# Patient Record
Sex: Female | Born: 1955 | Race: White | Hispanic: No | Marital: Single | Smoking: Never smoker
Health system: Southern US, Community
[De-identification: ages and names within clinical notes are randomized; demographics above are authoritative.]

## PROBLEM LIST (undated history)

## (undated) DIAGNOSIS — E0500 Thyrotoxicosis with diffuse goiter without thyrotoxic crisis or storm: Secondary | ICD-10-CM

## (undated) DIAGNOSIS — M1990 Unspecified osteoarthritis, unspecified site: Secondary | ICD-10-CM

## (undated) DIAGNOSIS — I10 Essential (primary) hypertension: Secondary | ICD-10-CM

## (undated) DIAGNOSIS — F329 Major depressive disorder, single episode, unspecified: Secondary | ICD-10-CM

## (undated) DIAGNOSIS — E785 Hyperlipidemia, unspecified: Secondary | ICD-10-CM

## (undated) DIAGNOSIS — E079 Disorder of thyroid, unspecified: Secondary | ICD-10-CM

## (undated) DIAGNOSIS — I669 Occlusion and stenosis of unspecified cerebral artery: Secondary | ICD-10-CM

## (undated) DIAGNOSIS — N390 Urinary tract infection, site not specified: Secondary | ICD-10-CM

## (undated) DIAGNOSIS — G4730 Sleep apnea, unspecified: Secondary | ICD-10-CM

## (undated) DIAGNOSIS — H269 Unspecified cataract: Secondary | ICD-10-CM

## (undated) DIAGNOSIS — I209 Angina pectoris, unspecified: Secondary | ICD-10-CM

## (undated) DIAGNOSIS — F32A Depression, unspecified: Secondary | ICD-10-CM

## (undated) DIAGNOSIS — F419 Anxiety disorder, unspecified: Secondary | ICD-10-CM

## (undated) DIAGNOSIS — K222 Esophageal obstruction: Secondary | ICD-10-CM

## (undated) DIAGNOSIS — N189 Chronic kidney disease, unspecified: Secondary | ICD-10-CM

## (undated) DIAGNOSIS — I2510 Atherosclerotic heart disease of native coronary artery without angina pectoris: Secondary | ICD-10-CM

## (undated) DIAGNOSIS — J45909 Unspecified asthma, uncomplicated: Secondary | ICD-10-CM

## (undated) DIAGNOSIS — E119 Type 2 diabetes mellitus without complications: Secondary | ICD-10-CM

## (undated) DIAGNOSIS — K635 Polyp of colon: Secondary | ICD-10-CM

## (undated) DIAGNOSIS — T7840XA Allergy, unspecified, initial encounter: Secondary | ICD-10-CM

## (undated) DIAGNOSIS — I639 Cerebral infarction, unspecified: Secondary | ICD-10-CM

## (undated) DIAGNOSIS — K922 Gastrointestinal hemorrhage, unspecified: Secondary | ICD-10-CM

## (undated) DIAGNOSIS — E669 Obesity, unspecified: Secondary | ICD-10-CM

## (undated) DIAGNOSIS — D649 Anemia, unspecified: Secondary | ICD-10-CM

## (undated) HISTORY — PX: APPENDECTOMY: SHX54

## (undated) HISTORY — DX: Hyperlipidemia, unspecified: E78.5

## (undated) HISTORY — DX: Anxiety disorder, unspecified: F41.9

## (undated) HISTORY — DX: Obesity, unspecified: E66.9

## (undated) HISTORY — PX: WISDOM TOOTH EXTRACTION: SHX21

## (undated) HISTORY — DX: Allergy, unspecified, initial encounter: T78.40XA

## (undated) HISTORY — DX: Polyp of colon: K63.5

## (undated) HISTORY — PX: DILATION AND CURETTAGE OF UTERUS: SHX78

## (undated) HISTORY — DX: Gastrointestinal hemorrhage, unspecified: K92.2

## (undated) HISTORY — DX: Urinary tract infection, site not specified: N39.0

## (undated) HISTORY — DX: Atherosclerotic heart disease of native coronary artery without angina pectoris: I25.10

## (undated) HISTORY — PX: CATARACT EXTRACTION: SUR2

## (undated) HISTORY — DX: Occlusion and stenosis of unspecified cerebral artery: I66.9

## (undated) HISTORY — DX: Sleep apnea, unspecified: G47.30

## (undated) HISTORY — DX: Esophageal obstruction: K22.2

## (undated) HISTORY — PX: OTHER SURGICAL HISTORY: SHX169

## (undated) HISTORY — PX: TONSILLECTOMY: SHX5217

## (undated) HISTORY — DX: Unspecified cataract: H26.9

## (undated) HISTORY — DX: Type 2 diabetes mellitus without complications: E11.9

## (undated) HISTORY — DX: Thyrotoxicosis with diffuse goiter without thyrotoxic crisis or storm: E05.00

## (undated) HISTORY — DX: Chronic kidney disease, unspecified: N18.9

## (undated) HISTORY — DX: Major depressive disorder, single episode, unspecified: F32.9

## (undated) HISTORY — DX: Disorder of thyroid, unspecified: E07.9

## (undated) HISTORY — PX: CARDIAC CATHETERIZATION: SHX172

## (undated) HISTORY — DX: Depression, unspecified: F32.A

---

## 1975-05-17 HISTORY — PX: TONSILLECTOMY: SUR1361

## 1986-05-16 HISTORY — PX: EYE SURGERY: SHX253

## 1997-10-22 ENCOUNTER — Other Ambulatory Visit: Admission: RE | Admit: 1997-10-22 | Discharge: 1997-10-22 | Payer: Self-pay | Admitting: Obstetrics and Gynecology

## 1997-10-30 ENCOUNTER — Other Ambulatory Visit: Admission: RE | Admit: 1997-10-30 | Discharge: 1997-10-30 | Payer: Self-pay | Admitting: Obstetrics and Gynecology

## 1998-07-22 ENCOUNTER — Ambulatory Visit (HOSPITAL_COMMUNITY): Admission: RE | Admit: 1998-07-22 | Discharge: 1998-07-22 | Payer: Self-pay | Admitting: Urology

## 1998-07-22 ENCOUNTER — Encounter: Payer: Self-pay | Admitting: Urology

## 1998-08-18 ENCOUNTER — Emergency Department (HOSPITAL_COMMUNITY): Admission: EM | Admit: 1998-08-18 | Discharge: 1998-08-18 | Payer: Self-pay

## 1998-08-19 ENCOUNTER — Emergency Department (HOSPITAL_COMMUNITY): Admission: EM | Admit: 1998-08-19 | Discharge: 1998-08-19 | Payer: Self-pay | Admitting: Emergency Medicine

## 1999-07-13 ENCOUNTER — Other Ambulatory Visit: Admission: RE | Admit: 1999-07-13 | Discharge: 1999-07-13 | Payer: Self-pay | Admitting: Obstetrics and Gynecology

## 2000-03-13 ENCOUNTER — Emergency Department (HOSPITAL_COMMUNITY): Admission: EM | Admit: 2000-03-13 | Discharge: 2000-03-13 | Payer: Self-pay | Admitting: Emergency Medicine

## 2000-03-13 ENCOUNTER — Encounter: Payer: Self-pay | Admitting: Emergency Medicine

## 2001-01-02 ENCOUNTER — Emergency Department (HOSPITAL_COMMUNITY): Admission: EM | Admit: 2001-01-02 | Discharge: 2001-01-02 | Payer: Self-pay | Admitting: Emergency Medicine

## 2001-01-02 ENCOUNTER — Encounter: Payer: Self-pay | Admitting: Emergency Medicine

## 2002-03-15 ENCOUNTER — Emergency Department (HOSPITAL_COMMUNITY): Admission: EM | Admit: 2002-03-15 | Discharge: 2002-03-15 | Payer: Self-pay | Admitting: Emergency Medicine

## 2003-11-08 ENCOUNTER — Emergency Department (HOSPITAL_COMMUNITY): Admission: EM | Admit: 2003-11-08 | Discharge: 2003-11-08 | Payer: Self-pay | Admitting: Emergency Medicine

## 2004-03-09 ENCOUNTER — Encounter: Admission: RE | Admit: 2004-03-09 | Discharge: 2004-03-09 | Payer: Self-pay | Admitting: Emergency Medicine

## 2004-06-22 ENCOUNTER — Ambulatory Visit: Payer: Self-pay | Admitting: Cardiology

## 2004-07-05 ENCOUNTER — Ambulatory Visit: Payer: Self-pay

## 2005-01-06 ENCOUNTER — Emergency Department (HOSPITAL_COMMUNITY): Admission: EM | Admit: 2005-01-06 | Discharge: 2005-01-06 | Payer: Self-pay | Admitting: Emergency Medicine

## 2005-02-08 ENCOUNTER — Ambulatory Visit: Payer: Self-pay | Admitting: Gastroenterology

## 2005-02-16 ENCOUNTER — Ambulatory Visit: Payer: Self-pay | Admitting: Gastroenterology

## 2005-02-16 DIAGNOSIS — K222 Esophageal obstruction: Secondary | ICD-10-CM

## 2005-03-02 ENCOUNTER — Encounter (INDEPENDENT_AMBULATORY_CARE_PROVIDER_SITE_OTHER): Payer: Self-pay | Admitting: Specialist

## 2005-03-02 ENCOUNTER — Ambulatory Visit: Payer: Self-pay | Admitting: Gastroenterology

## 2005-03-02 DIAGNOSIS — D126 Benign neoplasm of colon, unspecified: Secondary | ICD-10-CM

## 2006-11-23 IMAGING — CR DG CHEST 2V
2 series · 2 of 2 positions shown · non-contrast
Comparison: none

CLINICAL DATA: Shortness of breath.  Fever and cough.  
 CHEST - 2 VIEW:

[view not recorded (1 of 2)]
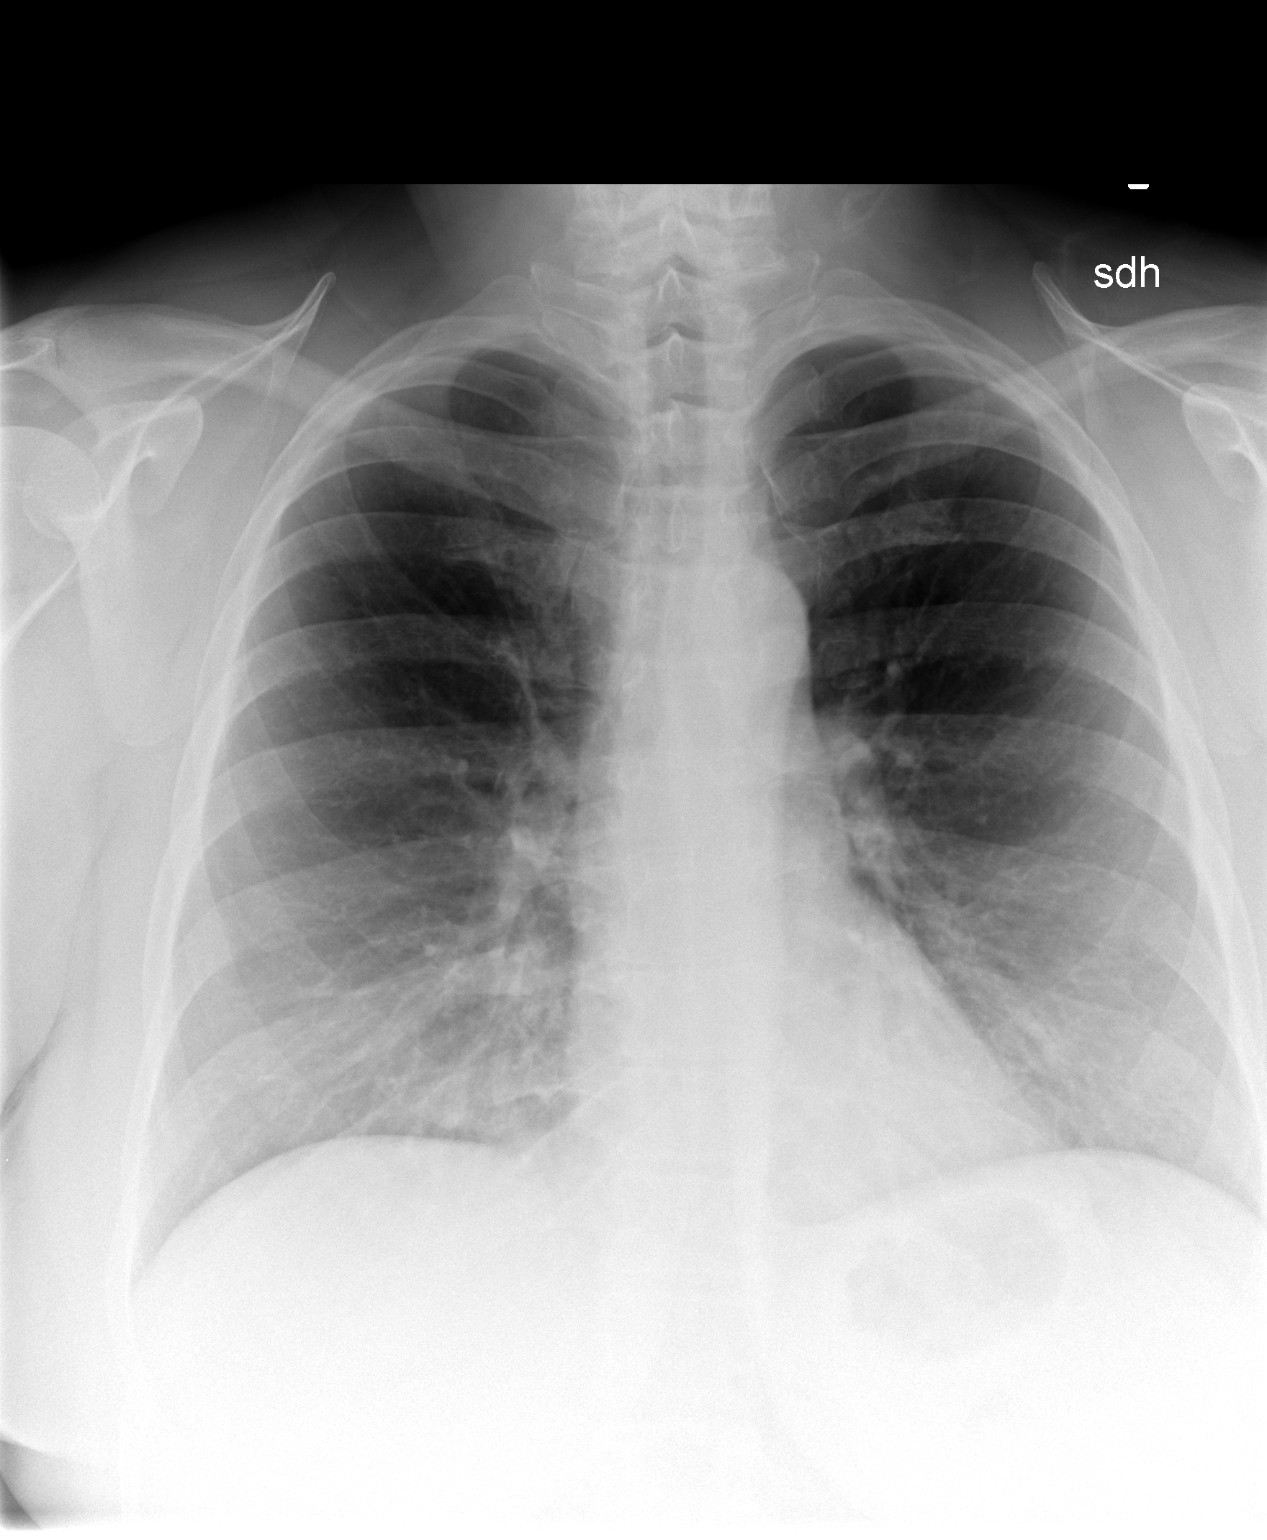

[view not recorded (2 of 2)]
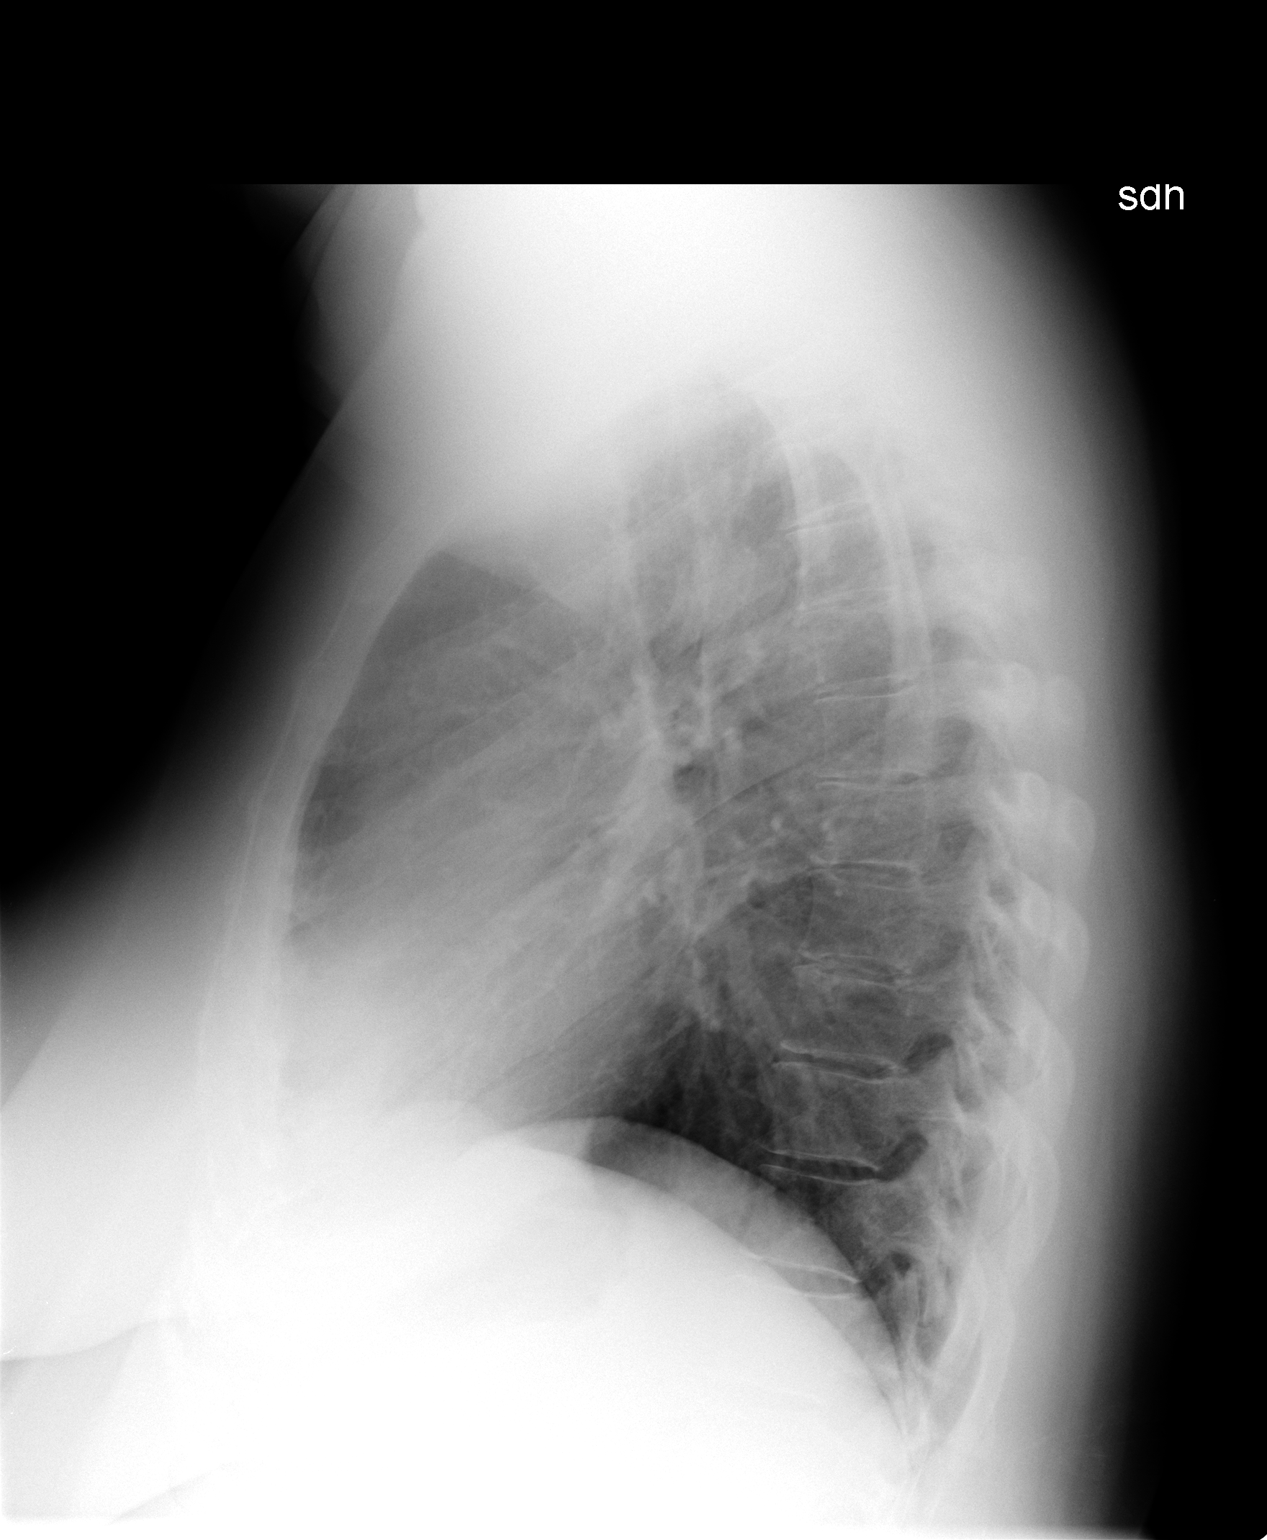

[2 of 2 positions shown; findings below may reference images not displayed]

FINDINGS: Lungs are clear.  No effusion.  Heart size normal.
IMPRESSION: Negative chest.

## 2007-06-24 ENCOUNTER — Emergency Department (HOSPITAL_COMMUNITY): Admission: EM | Admit: 2007-06-24 | Discharge: 2007-06-24 | Payer: Self-pay | Admitting: Emergency Medicine

## 2008-02-27 ENCOUNTER — Ambulatory Visit: Payer: Self-pay | Admitting: Cardiology

## 2008-02-28 ENCOUNTER — Ambulatory Visit: Payer: Self-pay | Admitting: Cardiology

## 2008-02-28 LAB — CONVERTED CEMR LAB
ALT: 20 units/L (ref 0–35)
AST: 21 units/L (ref 0–37)
Albumin: 3.5 g/dL (ref 3.5–5.2)
Alkaline Phosphatase: 85 units/L (ref 39–117)
BUN: 17 mg/dL (ref 6–23)
Basophils Absolute: 0 10*3/uL (ref 0.0–0.1)
Basophils Relative: 0 % (ref 0.0–3.0)
Bilirubin, Direct: 0.2 mg/dL (ref 0.0–0.3)
CO2: 30 meq/L (ref 19–32)
Calcium: 9.3 mg/dL (ref 8.4–10.5)
Chloride: 107 meq/L (ref 96–112)
Cholesterol: 202 mg/dL (ref 0–200)
Creatinine, Ser: 1.6 mg/dL — ABNORMAL HIGH (ref 0.4–1.2)
Direct LDL: 138.5 mg/dL
Eosinophils Absolute: 0.8 10*3/uL — ABNORMAL HIGH (ref 0.0–0.7)
Eosinophils Relative: 10.8 % — ABNORMAL HIGH (ref 0.0–5.0)
GFR calc Af Amer: 43 mL/min
GFR calc non Af Amer: 36 mL/min
Glucose, Bld: 123 mg/dL — ABNORMAL HIGH (ref 70–99)
HCT: 37.5 % (ref 36.0–46.0)
HDL: 34.5 mg/dL — ABNORMAL LOW (ref >39.0)
Hemoglobin: 12.8 g/dL (ref 12.0–15.0)
Lymphocytes Relative: 20.6 % (ref 12.0–46.0)
MCHC: 34 g/dL (ref 30.0–36.0)
MCV: 87.9 fL (ref 78.0–100.0)
Monocytes Absolute: 0.2 10*3/uL (ref 0.1–1.0)
Monocytes Relative: 2.5 % — ABNORMAL LOW (ref 3.0–12.0)
Neutro Abs: 4.7 10*3/uL (ref 1.4–7.7)
Neutrophils Relative %: 66.1 % (ref 43.0–77.0)
Platelets: 203 10*3/uL (ref 150–400)
Potassium: 4.9 meq/L (ref 3.5–5.1)
RBC: 4.27 M/uL (ref 3.87–5.11)
RDW: 13.5 % (ref 11.5–14.6)
Rheumatoid fact SerPl-aCnc: <20 intl units/mL — ABNORMAL LOW (ref 0.0–20.0)
Sodium: 143 meq/L (ref 135–145)
Total Bilirubin: 0.7 mg/dL (ref 0.3–1.2)
Total CHOL/HDL Ratio: 5.9
Total Protein: 6.7 g/dL (ref 6.0–8.3)
Triglycerides: 82 mg/dL (ref 0–149)
VLDL: 16 mg/dL (ref 0–40)
WBC: 7.2 10*3/uL (ref 4.5–10.5)

## 2008-03-12 ENCOUNTER — Ambulatory Visit (HOSPITAL_BASED_OUTPATIENT_CLINIC_OR_DEPARTMENT_OTHER): Admission: RE | Admit: 2008-03-12 | Discharge: 2008-03-12 | Payer: Self-pay | Admitting: Cardiology

## 2008-03-25 ENCOUNTER — Ambulatory Visit: Payer: Self-pay | Admitting: Pulmonary Disease

## 2008-05-15 DIAGNOSIS — I2510 Atherosclerotic heart disease of native coronary artery without angina pectoris: Secondary | ICD-10-CM | POA: Insufficient documentation

## 2008-05-15 DIAGNOSIS — F329 Major depressive disorder, single episode, unspecified: Secondary | ICD-10-CM | POA: Insufficient documentation

## 2008-05-22 ENCOUNTER — Ambulatory Visit: Payer: Self-pay | Admitting: Gastroenterology

## 2008-05-22 DIAGNOSIS — R195 Other fecal abnormalities: Secondary | ICD-10-CM

## 2008-05-22 LAB — CONVERTED CEMR LAB
Basophils Absolute: 0.1 10*3/uL (ref 0.0–0.1)
Basophils Relative: 1 % (ref 0.0–3.0)
Eosinophils Absolute: 0.5 10*3/uL (ref 0.0–0.7)
Eosinophils Relative: 6.9 % — ABNORMAL HIGH (ref 0.0–5.0)
HCT: 33.5 % — ABNORMAL LOW (ref 36.0–46.0)
Hemoglobin: 11 g/dL — ABNORMAL LOW (ref 12.0–15.0)
Lymphocytes Relative: 25.3 % (ref 12.0–46.0)
MCHC: 32.8 g/dL (ref 30.0–36.0)
MCV: 80.6 fL (ref 78.0–100.0)
Monocytes Absolute: 0.5 10*3/uL (ref 0.1–1.0)
Monocytes Relative: 6.6 % (ref 3.0–12.0)
Neutro Abs: 4.8 10*3/uL (ref 1.4–7.7)
Neutrophils Relative %: 60.2 % (ref 43.0–77.0)
Platelets: 222 10*3/uL (ref 150–400)
RBC: 4.16 M/uL (ref 3.87–5.11)
RDW: 13.2 % (ref 11.5–14.6)
WBC: 7.9 10*3/uL (ref 4.5–10.5)

## 2008-05-23 ENCOUNTER — Ambulatory Visit: Payer: Self-pay | Admitting: Gastroenterology

## 2008-05-27 ENCOUNTER — Ambulatory Visit: Payer: Self-pay | Admitting: Gastroenterology

## 2008-05-30 ENCOUNTER — Encounter: Payer: Self-pay | Admitting: Gastroenterology

## 2008-05-30 ENCOUNTER — Ambulatory Visit: Payer: Self-pay | Admitting: Gastroenterology

## 2008-06-02 ENCOUNTER — Encounter: Payer: Self-pay | Admitting: Gastroenterology

## 2008-06-03 ENCOUNTER — Telehealth: Payer: Self-pay | Admitting: Gastroenterology

## 2008-06-04 ENCOUNTER — Telehealth: Payer: Self-pay | Admitting: Gastroenterology

## 2008-06-05 ENCOUNTER — Ambulatory Visit: Payer: Self-pay | Admitting: Gastroenterology

## 2008-06-05 LAB — CONVERTED CEMR LAB
Basophils Absolute: 0.1 10*3/uL (ref 0.0–0.1)
Eosinophils Absolute: 0.4 10*3/uL (ref 0.0–0.7)
MCHC: 32.8 g/dL (ref 30.0–36.0)
MCV: 79.7 fL (ref 78.0–100.0)
Neutrophils Relative %: 63.8 % (ref 43.0–77.0)
Platelets: 226 10*3/uL (ref 150–400)
RDW: 13.7 % (ref 11.5–14.6)

## 2008-06-18 ENCOUNTER — Encounter: Payer: Self-pay | Admitting: Gastroenterology

## 2008-06-18 ENCOUNTER — Telehealth: Payer: Self-pay | Admitting: Gastroenterology

## 2008-06-23 ENCOUNTER — Ambulatory Visit: Payer: Self-pay | Admitting: Gastroenterology

## 2008-06-23 LAB — CONVERTED CEMR LAB
Basophils Relative: 1.5 % (ref 0.0–3.0)
Hemoglobin: 10.8 g/dL — ABNORMAL LOW (ref 12.0–15.0)
Lymphocytes Relative: 33.8 % (ref 12.0–46.0)
Monocytes Relative: 10.5 % (ref 3.0–12.0)
Neutro Abs: 2.9 10*3/uL (ref 1.4–7.7)
Neutrophils Relative %: 50.2 % (ref 43.0–77.0)
RBC: 4.15 M/uL (ref 3.87–5.11)

## 2008-06-25 ENCOUNTER — Ambulatory Visit: Payer: Self-pay | Admitting: Gastroenterology

## 2008-06-25 DIAGNOSIS — D509 Iron deficiency anemia, unspecified: Secondary | ICD-10-CM

## 2008-07-24 ENCOUNTER — Telehealth: Payer: Self-pay | Admitting: Gastroenterology

## 2008-07-25 ENCOUNTER — Ambulatory Visit: Payer: Self-pay | Admitting: Gastroenterology

## 2008-07-25 LAB — CONVERTED CEMR LAB
Eosinophils Absolute: 0.4 10*3/uL (ref 0.0–0.7)
Eosinophils Relative: 4.1 % (ref 0.0–5.0)
HCT: 34.1 % — ABNORMAL LOW (ref 36.0–46.0)
Hemoglobin: 11 g/dL — ABNORMAL LOW (ref 12.0–15.0)
MCV: 75.5 fL — ABNORMAL LOW (ref 78.0–100.0)
Monocytes Absolute: 0.5 10*3/uL (ref 0.1–1.0)
Neutro Abs: 5.8 10*3/uL (ref 1.4–7.7)
Platelets: 217 10*3/uL (ref 150–400)
RDW: 15 % — ABNORMAL HIGH (ref 11.5–14.6)
WBC: 9.4 10*3/uL (ref 4.5–10.5)

## 2009-05-16 HISTORY — PX: CARPAL TUNNEL RELEASE: SHX101

## 2010-03-29 ENCOUNTER — Emergency Department (HOSPITAL_COMMUNITY): Admission: EM | Admit: 2010-03-29 | Discharge: 2010-03-29 | Payer: Self-pay | Admitting: Emergency Medicine

## 2010-04-01 ENCOUNTER — Emergency Department (HOSPITAL_COMMUNITY): Admission: EM | Admit: 2010-04-01 | Discharge: 2010-04-01 | Payer: Self-pay | Admitting: Emergency Medicine

## 2010-04-05 ENCOUNTER — Emergency Department (HOSPITAL_COMMUNITY): Admission: EM | Admit: 2010-04-05 | Discharge: 2010-04-05 | Payer: Self-pay | Admitting: Family Medicine

## 2010-04-12 ENCOUNTER — Emergency Department (HOSPITAL_COMMUNITY): Admission: EM | Admit: 2010-04-12 | Discharge: 2010-04-12 | Payer: Self-pay | Admitting: Family Medicine

## 2010-09-28 NOTE — Procedures (Signed)
Lacey Wallace, Lacey Wallace                ACCOUNT NO.:  [id number]   MEDICAL RECORD NO.:  [id number]          PATIENT TYPE:  OUT   LOCATION:  SLEEP CENTER                 FACILITY:  [facility]   PHYSICIAN:  Barbaraann Share, MD,FCCPDATE OF BIRTH:  12/24/54   DATE OF STUDY:  03/12/2008                            NOCTURNAL POLYSOMNOGRAM   REFERRING PHYSICIAN:  Everardo Beals. Juanda Chance, MD, [facility]   LOCATION:  Sleep Lab.   REFERRING PHYSICIAN:  Everardo Beals. Juanda Chance, MD, [facility]   DATE OF STUDY:  March 12, 2008.   INDICATION FOR STUDY:  Hypersomnia with sleep apnea.   EPWORTH SCORE:  4.   SLEEP ARCHITECTURE:  The patient had a total sleep time of only a 149  minutes with no slow-wave sleep and very little REM.  Sleep-onset  latency was prolonged at 60 minutes, and REM onset was very prolonged at  227 minutes.  Sleep efficiency was very poor at 37%.   RESPIRATORY DATA:  The patient was found to have 6 obstructive apneas  and 7 hypopneas for an apnea-hypopnea index of 5 events per hour.  She  was also noted to have 30 respiratory effort-related arousals giving her  a respiratory disturbance index of 17 events per hour.  The events were  not positional.  There was loud snoring noted throughout.  The patient  did not meet split-night protocol secondary to too few events during the  night.   OXYGEN DATA:  There was O2 desaturation as low as 86% with the patient's  obstructive events.   CARDIAC DATA:  No clinically significant arrhythmias were noted.   MOVEMENT/PARASOMNIA:  The patient was found to have no significant leg  jerks; however, she did have a lot of coughing and throat clearing  during the night.   IMPRESSION/RECOMMENDATION:  1. Mild obstructive sleep apnea/hypopnea syndrome with an apnea-      hypopnea index of 5 events per hour, and a respiratory disturbance      index of 17 events per hour.  There was oxygen desaturation as low      as 86%.  She did not meet split-night criteria secondary to  the      small numbers of events.  Treatment for this degree of sleep apnea      can focus on weight loss alone if applicable, upper airway surgery,      oral appliance, and also continuous positive airway pressure.  The      decision to treat this mild degree of sleep apnea should primarily      be based on its impact to her quality of life.  2. Significant coughing and throat clearing noted throughout the night      that is very suggestive of gastroesophageal reflux disease.  This      could also be associated with postnasal drip.  Clinical correlation      is suggested.      Barbaraann Share, MD,FCCP  Diplomate, American Board of Sleep  Medicine  Electronically Signed     [facility]/MEDQ  D:  03/25/2008 15:26:32  T:  03/26/2008 02:44:28  Job:  [id number]

## 2010-09-28 NOTE — Assessment & Plan Note (Signed)
[facility]                            CARDIOLOGY OFFICE NOTE   Barry, [place] MURIAL BEAM                       MRN:          [id number]  DATE:02/27/2008                            DOB:          October 25, 1955    CONSULTING PHYSICIAN:  [facility]   REASON FOR CONSULTATION:  Evaluation of chest pain and shortness of  breath.   CLINICAL HISTORY:  Lacey Wallace is 55 years old and was referred for  evaluation of chest pain and shortness of breath.  About 2 weeks ago,  she woke up at 5 in the morning feeling very short of breath and with  heavy pressure on her chest.  She had some radiation of pain up into the  neck, and she had some tingling of the left foot.  These symptoms lasted  for a number of minutes until she took a nitroglycerin and then her  symptoms eased off within the next couple of minutes.  She had another  episode very similar to this also occurring about 5:00 a.m. 2-3 days  later.  She was seen in urgent medical care and was evaluated with a  cardiogram and examination and no abnormalities were found, and she was  advised to come here for further evaluation.   Her past medical history is significant for previous tonsillectomy and  bladder surgery.  She has had no history of hypertension or diabetes.   Her current medications include multivitamins and low-dose aspirin.   FAMILY HISTORY:  Her mother died at a 38 from complications of  scleroderma.  She has 2 sisters and a father with no cardiovascular  disease.   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  She is divorced, but has two children,  24 and 21 who live with her.   REVIEW OF SYSTEMS:  Positive for symptoms of snoring related to her by  her children.  She does not describe symptoms of sleepiness during the  day.  She has no history of sleep apnea.   Review of systems is also positive for fatigue and shortness of  breath  with exertion.   PHYSICAL EXAMINATION:  VITAL SIGNS:  Blood pressure is 134/87, the pulse  80 and regular.  NECK:  There was no venous distension.  The carotid pulses were full  without bruits.  CHEST:  Clear with no rales or rhonchi.  CARDIAC:  Rhythm was regular.  The first and second heart sounds were  normal.  There were no murmurs or gallops.  ABDOMEN:  Soft with normal bowel sounds.  There was no  hepatosplenomegaly.  Peripheral pulses were equal.  There was no  peripheral edema.  MUSCULOSKELETAL:  No deformities.  SKIN:  Warm and dry.  NEUROLOGIC:  No focal neurological signs.   Electrocardiogram was normal.   IMPRESSION:  1. Chest pain and shortness of breath of uncertain etiology.  2. Excess weight.   RECOMMENDATIONS:  We had evaluated Lacey Wallace in 2006 with an  echocardiogram which was normal and the rest stress Myoview scan which  showed no evidence of ischemia.  Her risk profile is quite low for  vascular disease, and I think it is unlikely that her symptoms are  ischemic.  I am suspicious that she could have sleep apnea since she  does have a history of snoring and she is overweight.  I am also  suspicious these symptoms could be triggered by sleep apnea.  She is  agreeable to having this evaluated.  Furthermore, we will arrange for  her to have a sleep study.  She is also worried about scleroderma, and  although she has no physical signs of this, we will plan to get an ANA  and rheumatoid factor to evaluate this further as well as a lipid and  liver, BMP, CBC, and TSH.  We will be in touch by phone after we have  the results of these tests and decide if any further evaluation is  needed.     Bruce Elvera Lennox Juanda Chance, MD, [facility]  Electronically Signed    [facility]/MedQ  DD: 02/27/2008  DT: 02/28/2008  Job #: [id number]

## 2010-11-19 ENCOUNTER — Encounter: Payer: Self-pay | Admitting: Cardiology

## 2013-03-08 ENCOUNTER — Other Ambulatory Visit: Payer: Self-pay | Admitting: Neurosurgery

## 2013-03-27 ENCOUNTER — Encounter (HOSPITAL_COMMUNITY): Payer: Self-pay | Admitting: Pharmacy Technician

## 2013-04-01 ENCOUNTER — Encounter (HOSPITAL_COMMUNITY): Payer: Self-pay

## 2013-04-01 ENCOUNTER — Encounter (HOSPITAL_COMMUNITY)
Admission: RE | Admit: 2013-04-01 | Discharge: 2013-04-01 | Disposition: A | Discharge status: Home / Self Care | Payer: BC Managed Care – PPO | Source: Ambulatory Visit | Attending: Neurosurgery | Admitting: Neurosurgery

## 2013-04-01 ENCOUNTER — Other Ambulatory Visit (HOSPITAL_COMMUNITY): Payer: Self-pay | Admitting: *Deleted

## 2013-04-01 DIAGNOSIS — Z01818 Encounter for other preprocedural examination: Secondary | ICD-10-CM | POA: Insufficient documentation

## 2013-04-01 DIAGNOSIS — Z01812 Encounter for preprocedural laboratory examination: Secondary | ICD-10-CM | POA: Insufficient documentation

## 2013-04-01 HISTORY — DX: Angina pectoris, unspecified: I20.9

## 2013-04-01 HISTORY — DX: Unspecified asthma, uncomplicated: J45.909

## 2013-04-01 HISTORY — DX: Cerebral infarction, unspecified: I63.9

## 2013-04-01 HISTORY — DX: Anemia, unspecified: D64.9

## 2013-04-01 HISTORY — DX: Unspecified osteoarthritis, unspecified site: M19.90

## 2013-04-01 LAB — CBC
HCT: 38.5 % (ref 36.0–46.0)
MCH: 30.5 pg (ref 26.0–34.0)
Platelets: 154 10*3/uL (ref 150–400)
RDW: 13.4 % (ref 11.5–15.5)
WBC: 8.7 10*3/uL (ref 4.0–10.5)

## 2013-04-01 LAB — BASIC METABOLIC PANEL
CO2: 28 mEq/L (ref 19–32)
Chloride: 106 mEq/L (ref 96–112)
Creatinine, Ser: 1.36 mg/dL — ABNORMAL HIGH (ref 0.50–1.10)
GFR calc Af Amer: 49 mL/min — ABNORMAL LOW (ref >90)
GFR calc non Af Amer: 42 mL/min — ABNORMAL LOW (ref >90)
Glucose, Bld: 100 mg/dL — ABNORMAL HIGH (ref 70–99)
Potassium: 4.7 mEq/L (ref 3.5–5.1)

## 2013-04-01 LAB — SURGICAL PCR SCREEN
MRSA, PCR: NEGATIVE
Staphylococcus aureus: NEGATIVE

## 2013-04-01 NOTE — Pre-Procedure Instructions (Signed)
Lacey Wallace  04/01/2013   Your procedure is scheduled on:  Friday, April 05, 2013 at 7:30 AM.   Report to [facility] Entrance "A" at 5:30 AM.   Call this number if you have problems the morning of surgery: [phone]   Remember:   Do not eat food or drink liquids after midnight Thursday, 04/04/13.   Take these medicines the morning of surgery with A SIP OF WATER: pregabalin (LYRICA)  Stop Vitamins as of today, 04/01/13.    Do not wear jewelry, make-up or nail polish.  Do not wear lotions, powders, or perfumes. You may wear deodorant.  Do not shave 48 hours prior to surgery.   Do not bring valuables to the hospital.  [facility] is not responsible                  for any belongings or valuables.               Contacts, dentures or bridgework may not be worn into surgery.  Leave suitcase in the car. After surgery it may be brought to your room.  For patients admitted to the hospital, discharge time is determined by your                treatment team.            Special Instructions: Shower using CHG 2 nights before surgery and the night before surgery.  If you shower the day of surgery use CHG.  Use special wash - you have one bottle of CHG for all showers.  You should use approximately 1/3 of the bottle for each shower.   Please read over the following fact sheets that you were given: Pain Booklet, Coughing and Deep Breathing, MRSA Information and Surgical Site Infection Prevention

## 2013-04-16 ENCOUNTER — Encounter (HOSPITAL_COMMUNITY): Payer: Self-pay | Admitting: *Deleted

## 2013-04-16 MED ORDER — DEXAMETHASONE SODIUM PHOSPHATE 10 MG/ML IJ SOLN
10.0000 mg | INTRAMUSCULAR | Status: AC
  Administered 2013-04-17: 10 mg via INTRAVENOUS
  Filled 2013-04-16 (×2): qty 1

## 2013-04-16 MED ORDER — CEFAZOLIN SODIUM-DEXTROSE 2-3 GM-% IV SOLR
2.0000 g | INTRAVENOUS | Status: AC
  Administered 2013-04-17: 2 g via INTRAVENOUS
  Filled 2013-04-16: qty 50

## 2013-04-17 ENCOUNTER — Inpatient Hospital Stay (HOSPITAL_COMMUNITY): Payer: BC Managed Care – PPO | Admitting: Anesthesiology

## 2013-04-17 ENCOUNTER — Inpatient Hospital Stay (HOSPITAL_COMMUNITY): Payer: BC Managed Care – PPO

## 2013-04-17 ENCOUNTER — Encounter (HOSPITAL_COMMUNITY)
Admission: RE | Disposition: A | Discharge status: Home / Self Care | Payer: Self-pay | Source: Ambulatory Visit | Attending: Neurosurgery

## 2013-04-17 ENCOUNTER — Encounter (HOSPITAL_COMMUNITY): Payer: Self-pay | Admitting: *Deleted

## 2013-04-17 ENCOUNTER — Encounter (HOSPITAL_COMMUNITY): Payer: BC Managed Care – PPO | Admitting: Anesthesiology

## 2013-04-17 ENCOUNTER — Inpatient Hospital Stay (HOSPITAL_COMMUNITY)
Admission: RE | Admit: 2013-04-17 | Discharge: 2013-04-18 | DRG: 473 | Disposition: A | Discharge status: Home / Self Care | Payer: BC Managed Care – PPO | Source: Ambulatory Visit | Attending: Neurosurgery | Admitting: Neurosurgery

## 2013-04-17 DIAGNOSIS — Z79899 Other long term (current) drug therapy: Secondary | ICD-10-CM

## 2013-04-17 DIAGNOSIS — Z8673 Personal history of transient ischemic attack (TIA), and cerebral infarction without residual deficits: Secondary | ICD-10-CM

## 2013-04-17 DIAGNOSIS — M4712 Other spondylosis with myelopathy, cervical region: Principal | ICD-10-CM | POA: Diagnosis present

## 2013-04-17 DIAGNOSIS — F329 Major depressive disorder, single episode, unspecified: Secondary | ICD-10-CM | POA: Diagnosis present

## 2013-04-17 DIAGNOSIS — F3289 Other specified depressive episodes: Secondary | ICD-10-CM | POA: Diagnosis present

## 2013-04-17 DIAGNOSIS — F1210 Cannabis abuse, uncomplicated: Secondary | ICD-10-CM | POA: Diagnosis present

## 2013-04-17 DIAGNOSIS — Z8249 Family history of ischemic heart disease and other diseases of the circulatory system: Secondary | ICD-10-CM

## 2013-04-17 DIAGNOSIS — Z9089 Acquired absence of other organs: Secondary | ICD-10-CM

## 2013-04-17 HISTORY — PX: ANTERIOR CERVICAL DECOMP/DISCECTOMY FUSION: SHX1161

## 2013-04-17 LAB — BASIC METABOLIC PANEL
BUN: 19 mg/dL (ref 6–23)
Chloride: 106 mEq/L (ref 96–112)
Creatinine, Ser: 1.28 mg/dL — ABNORMAL HIGH (ref 0.50–1.10)
GFR calc Af Amer: 52 mL/min — ABNORMAL LOW (ref >90)
Potassium: 4.1 mEq/L (ref 3.5–5.1)
Sodium: 141 mEq/L (ref 135–145)

## 2013-04-17 LAB — CBC
HCT: 40.3 % (ref 36.0–46.0)
Platelets: 204 10*3/uL (ref 150–400)
RBC: 4.55 MIL/uL (ref 3.87–5.11)
RDW: 13.2 % (ref 11.5–15.5)
WBC: 8.1 10*3/uL (ref 4.0–10.5)

## 2013-04-17 SURGERY — ANTERIOR CERVICAL DECOMPRESSION/DISCECTOMY FUSION 3 LEVELS
Anesthesia: General

## 2013-04-17 MED ORDER — ROCURONIUM BROMIDE 100 MG/10ML IV SOLN
INTRAVENOUS | Status: DC | PRN
  Administered 2013-04-17: 50 mg via INTRAVENOUS
  Administered 2013-04-17 (×2): 20 mg via INTRAVENOUS

## 2013-04-17 MED ORDER — ONDANSETRON HCL 4 MG/2ML IJ SOLN
INTRAMUSCULAR | Status: DC | PRN
  Administered 2013-04-17: 4 mg via INTRAVENOUS

## 2013-04-17 MED ORDER — PROPOFOL 10 MG/ML IV BOLUS
INTRAVENOUS | Status: DC | PRN
  Administered 2013-04-17: 200 mg via INTRAVENOUS

## 2013-04-17 MED ORDER — SODIUM CHLORIDE 0.9 % IV SOLN: 250.0000 mL | INTRAVENOUS | Status: DC

## 2013-04-17 MED ORDER — SODIUM CHLORIDE 0.9 % IR SOLN
Status: DC | PRN
  Administered 2013-04-17: 15:00:00

## 2013-04-17 MED ORDER — HYDROMORPHONE HCL PF 1 MG/ML IJ SOLN: 0.5000 mg | INTRAMUSCULAR | Status: DC | PRN

## 2013-04-17 MED ORDER — THROMBIN 5000 UNITS EX SOLR
OROMUCOSAL | Status: DC | PRN
  Administered 2013-04-17: 15:00:00 via TOPICAL

## 2013-04-17 MED ORDER — SODIUM CHLORIDE 0.9 % IJ SOLN
3.0000 mL | Freq: Two times a day (BID) | INTRAMUSCULAR | Status: DC
  Administered 2013-04-17: 3 mL via INTRAVENOUS

## 2013-04-17 MED ORDER — FENTANYL CITRATE 0.05 MG/ML IJ SOLN
INTRAMUSCULAR | Status: DC | PRN
  Administered 2013-04-17: 100 ug via INTRAVENOUS
  Administered 2013-04-17: 150 ug via INTRAVENOUS
  Administered 2013-04-17: 100 ug via INTRAVENOUS

## 2013-04-17 MED ORDER — ONDANSETRON HCL 4 MG/2ML IJ SOLN
INTRAMUSCULAR | Status: AC
  Filled 2013-04-17: qty 2

## 2013-04-17 MED ORDER — HYDROMORPHONE HCL PF 1 MG/ML IJ SOLN: 0.2500 mg | INTRAMUSCULAR | Status: DC | PRN

## 2013-04-17 MED ORDER — PHENOL 1.4 % MT LIQD: 1.0000 | OROMUCOSAL | Status: DC | PRN

## 2013-04-17 MED ORDER — CEFAZOLIN SODIUM 1-5 GM-% IV SOLN
1.0000 g | Freq: Three times a day (TID) | INTRAVENOUS | Status: AC
  Administered 2013-04-17 – 2013-04-18 (×2): 1 g via INTRAVENOUS
  Filled 2013-04-17 (×2): qty 50

## 2013-04-17 MED ORDER — DOCUSATE SODIUM 100 MG PO CAPS
100.0000 mg | ORAL_CAPSULE | Freq: Two times a day (BID) | ORAL | Status: DC
  Administered 2013-04-17: 100 mg via ORAL
  Filled 2013-04-17 (×3): qty 1

## 2013-04-17 MED ORDER — ADULT MULTIVITAMIN W/MINERALS CH
1.0000 | ORAL_TABLET | ORAL | Status: DC
  Filled 2013-04-17: qty 1

## 2013-04-17 MED ORDER — ONDANSETRON HCL 4 MG/2ML IJ SOLN: 4.0000 mg | Freq: Once | INTRAMUSCULAR | Status: DC | PRN

## 2013-04-17 MED ORDER — LACTATED RINGERS IV SOLN
INTRAVENOUS | Status: DC
  Administered 2013-04-17 – 2013-04-18 (×2): via INTRAVENOUS

## 2013-04-17 MED ORDER — ACETAMINOPHEN 650 MG RE SUPP: 650.0000 mg | RECTAL | Status: DC | PRN

## 2013-04-17 MED ORDER — HYDROMORPHONE HCL PF 1 MG/ML IJ SOLN
INTRAMUSCULAR | Status: AC
  Filled 2013-04-17: qty 1

## 2013-04-17 MED ORDER — PREGABALIN 50 MG PO CAPS
50.0000 mg | ORAL_CAPSULE | Freq: Every day | ORAL | Status: DC
  Administered 2013-04-17: 50 mg via ORAL
  Filled 2013-04-17: qty 1

## 2013-04-17 MED ORDER — CYCLOBENZAPRINE HCL 10 MG PO TABS: 10.0000 mg | ORAL_TABLET | Freq: Three times a day (TID) | ORAL | Status: DC | PRN

## 2013-04-17 MED ORDER — MENTHOL 3 MG MT LOZG: 1.0000 | LOZENGE | OROMUCOSAL | Status: DC | PRN

## 2013-04-17 MED ORDER — NEOSTIGMINE METHYLSULFATE 1 MG/ML IJ SOLN
INTRAMUSCULAR | Status: DC | PRN
  Administered 2013-04-17: 5 mg via INTRAVENOUS

## 2013-04-17 MED ORDER — THROMBIN 20000 UNITS EX SOLR
CUTANEOUS | Status: DC | PRN
  Administered 2013-04-17: 15:00:00 via TOPICAL

## 2013-04-17 MED ORDER — GLYCOPYRROLATE 0.2 MG/ML IJ SOLN
INTRAMUSCULAR | Status: DC | PRN
  Administered 2013-04-17: 0.6 mg via INTRAVENOUS

## 2013-04-17 MED ORDER — OXYCODONE-ACETAMINOPHEN 5-325 MG PO TABS: 1.0000 | ORAL_TABLET | ORAL | Status: DC | PRN

## 2013-04-17 MED ORDER — ONDANSETRON HCL 4 MG/2ML IJ SOLN
4.0000 mg | Freq: Once | INTRAMUSCULAR | Status: AC | PRN
  Administered 2013-04-17: 4 mg via INTRAVENOUS

## 2013-04-17 MED ORDER — ALBUMIN HUMAN 5 % IV SOLN
INTRAVENOUS | Status: DC | PRN
  Administered 2013-04-17: 15:00:00 via INTRAVENOUS

## 2013-04-17 MED ORDER — SODIUM CHLORIDE 0.9 % IJ SOLN: 3.0000 mL | INTRAMUSCULAR | Status: DC | PRN

## 2013-04-17 MED ORDER — LACTATED RINGERS IV SOLN
INTRAVENOUS | Status: DC | PRN
  Administered 2013-04-17 (×2): via INTRAVENOUS

## 2013-04-17 MED ORDER — ONDANSETRON HCL 4 MG/2ML IJ SOLN: 4.0000 mg | INTRAMUSCULAR | Status: DC | PRN

## 2013-04-17 MED ORDER — ALUM & MAG HYDROXIDE-SIMETH 200-200-20 MG/5ML PO SUSP: 30.0000 mL | Freq: Four times a day (QID) | ORAL | Status: DC | PRN

## 2013-04-17 MED ORDER — 0.9 % SODIUM CHLORIDE (POUR BTL) OPTIME
TOPICAL | Status: DC | PRN
  Administered 2013-04-17: 1000 mL

## 2013-04-17 MED ORDER — LIDOCAINE HCL (CARDIAC) 20 MG/ML IV SOLN
INTRAVENOUS | Status: DC | PRN
  Administered 2013-04-17: 100 mg via INTRAVENOUS

## 2013-04-17 MED ORDER — MIDAZOLAM HCL 5 MG/5ML IJ SOLN
INTRAMUSCULAR | Status: DC | PRN
  Administered 2013-04-17: 2 mg via INTRAVENOUS

## 2013-04-17 MED ORDER — CALCIUM CARBONATE ANTACID 500 MG PO CHEW
2.0000 | CHEWABLE_TABLET | Freq: Every day | ORAL | Status: DC
  Administered 2013-04-17: 400 mg via ORAL
  Filled 2013-04-17 (×2): qty 2

## 2013-04-17 MED ORDER — HYDROMORPHONE HCL PF 1 MG/ML IJ SOLN
0.2500 mg | INTRAMUSCULAR | Status: DC | PRN
  Administered 2013-04-17 (×2): 0.5 mg via INTRAVENOUS

## 2013-04-17 MED ORDER — ACETAMINOPHEN 325 MG PO TABS: 650.0000 mg | ORAL_TABLET | ORAL | Status: DC | PRN

## 2013-04-17 SURGICAL SUPPLY — 64 items
ADH SKN CLS APL DERMABOND .7 (GAUZE/BANDAGES/DRESSINGS) ×1
APL SKNCLS STERI-STRIP NONHPOA (GAUZE/BANDAGES/DRESSINGS) ×1
BAG DECANTER FOR FLEXI CONT (MISCELLANEOUS) ×2 IMPLANT
BENZOIN TINCTURE PRP APPL 2/3 (GAUZE/BANDAGES/DRESSINGS) ×2 IMPLANT
BIT DRILL 13 (BIT) ×2 IMPLANT
BRUSH SCRUB EZ PLAIN DRY (MISCELLANEOUS) ×2 IMPLANT
BUR MATCHSTICK NEURO 3.0 LAGG (BURR) ×2 IMPLANT
CANISTER SUCT 3000ML (MISCELLANEOUS) ×2 IMPLANT
CONT SPEC 4OZ CLIKSEAL STRL BL (MISCELLANEOUS) ×2 IMPLANT
DECANTER SPIKE VIAL GLASS SM (MISCELLANEOUS) ×2 IMPLANT
DERMABOND ADVANCED (GAUZE/BANDAGES/DRESSINGS) ×1
DERMABOND ADVANCED .7 DNX12 (GAUZE/BANDAGES/DRESSINGS) ×1 IMPLANT
DRAIN SNY WOU 7FLT (WOUND CARE) ×2 IMPLANT
DRAPE C-ARM 42X72 X-RAY (DRAPES) ×4 IMPLANT
DRAPE LAPAROTOMY 100X72 PEDS (DRAPES) ×2 IMPLANT
DRAPE MICROSCOPE ZEISS OPMI (DRAPES) ×2 IMPLANT
DRAPE POUCH INSTRU U-SHP 10X18 (DRAPES) ×2 IMPLANT
DRSG OPSITE 4X5.5 SM (GAUZE/BANDAGES/DRESSINGS) ×2 IMPLANT
DRSG OPSITE POSTOP 4X6 (GAUZE/BANDAGES/DRESSINGS) ×2 IMPLANT
DURAPREP 6ML APPLICATOR 50/CS (WOUND CARE) ×2 IMPLANT
ELECT COATED BLADE 2.86 ST (ELECTRODE) ×2 IMPLANT
ELECT REM PT RETURN 9FT ADLT (ELECTROSURGICAL) ×2
ELECTRODE REM PT RTRN 9FT ADLT (ELECTROSURGICAL) ×1 IMPLANT
EVACUATOR SILICONE 100CC (DRAIN) ×2 IMPLANT
GAUZE SPONGE 4X4 16PLY XRAY LF (GAUZE/BANDAGES/DRESSINGS) ×1 IMPLANT
GLOVE BIO SURGEON STRL SZ8 (GLOVE) ×2 IMPLANT
GLOVE BIOGEL PI IND STRL 8 (GLOVE) IMPLANT
GLOVE BIOGEL PI INDICATOR 8 (GLOVE) ×1
GLOVE ECLIPSE 7.5 STRL STRAW (GLOVE) ×6 IMPLANT
GLOVE ECLIPSE 8.5 STRL (GLOVE) ×1 IMPLANT
GLOVE EXAM NITRILE LRG STRL (GLOVE) IMPLANT
GLOVE EXAM NITRILE MD LF STRL (GLOVE) IMPLANT
GLOVE EXAM NITRILE XL STR (GLOVE) IMPLANT
GLOVE EXAM NITRILE XS STR PU (GLOVE) IMPLANT
GLOVE INDICATOR 8.5 STRL (GLOVE) ×3 IMPLANT
GOWN BRE IMP SLV AUR LG STRL (GOWN DISPOSABLE) IMPLANT
GOWN BRE IMP SLV AUR XL STRL (GOWN DISPOSABLE) ×2 IMPLANT
GOWN STRL REIN 2XL LVL4 (GOWN DISPOSABLE) ×1 IMPLANT
HEAD HALTER (SOFTGOODS) ×2 IMPLANT
HEMOSTAT POWDER KIT SURGIFOAM (HEMOSTASIS) ×2 IMPLANT
KIT BASIN OR (CUSTOM PROCEDURE TRAY) ×2 IMPLANT
KIT ROOM TURNOVER OR (KITS) ×2 IMPLANT
NEEDLE SPNL 20GX3.5 QUINCKE YW (NEEDLE) ×2 IMPLANT
NS IRRIG 1000ML POUR BTL (IV SOLUTION) ×2 IMPLANT
PACK LAMINECTOMY NEURO (CUSTOM PROCEDURE TRAY) ×2 IMPLANT
PLATE 3 60XNS SPNE CVD ANT T (Plate) IMPLANT
PLATE 3 ATLANTIS TRANS (Plate) ×2 IMPLANT
PUTTY BONE DBX 2.5 MIS (Bone Implant) ×1 IMPLANT
RUBBERBAND STERILE (MISCELLANEOUS) ×4 IMPLANT
SCREW ST FIX 4 ATL 3120213 (Screw) ×16 IMPLANT
SPACER COLONIAL 7X14X12 (Spacer) ×4 IMPLANT
SPACER COLONIAL LGE 8MM 7DEG (Spacer) ×2 IMPLANT
SPONGE GAUZE 4X4 12PLY (GAUZE/BANDAGES/DRESSINGS) ×2 IMPLANT
SPONGE INTESTINAL PEANUT (DISPOSABLE) ×2 IMPLANT
SPONGE SURGIFOAM ABS GEL 100 (HEMOSTASIS) ×2 IMPLANT
STRIP CLOSURE SKIN 1/2X4 (GAUZE/BANDAGES/DRESSINGS) ×2 IMPLANT
SUT VIC AB 3-0 SH 8-18 (SUTURE) ×2 IMPLANT
SUT VICRYL 4-0 PS2 18IN ABS (SUTURE) ×2 IMPLANT
SYR 20ML ECCENTRIC (SYRINGE) ×2 IMPLANT
TAPE CLOTH 4X10 WHT NS (GAUZE/BANDAGES/DRESSINGS) ×2 IMPLANT
TOWEL OR 17X24 6PK STRL BLUE (TOWEL DISPOSABLE) ×2 IMPLANT
TOWEL OR 17X26 10 PK STRL BLUE (TOWEL DISPOSABLE) ×2 IMPLANT
TRAP SPECIMEN MUCOUS 40CC (MISCELLANEOUS) ×2 IMPLANT
WATER STERILE IRR 1000ML POUR (IV SOLUTION) ×2 IMPLANT

## 2013-04-17 NOTE — Transfer of Care (Signed)
Immediate Anesthesia Transfer of Care Note  Patient: MARYCLAIRE STOECKER  Procedure(s) Performed: Procedure(s): ANTERIOR CERVICAL DECOMPRESSION/DISCECTOMY FUSION CERVICAL FOUR/FIVE, CERVICAL FIVE/SIX, CERVICAL SIX/SEVEN  (N/A)  Patient Location: PACU  Anesthesia Type:General  Level of Consciousness: awake  Airway & Oxygen Therapy: Patient Spontanous Breathing and Patient connected to face mask oxygen  Post-op Assessment: Report given to PACU RN and Post -op Vital signs reviewed and stable  Post vital signs: Reviewed and stable  Complications: No apparent anesthesia complications

## 2013-04-17 NOTE — Anesthesia Postprocedure Evaluation (Signed)
  Anesthesia Post-op Note  Patient: Lacey Wallace  Procedure(s) Performed: Procedure(s): ANTERIOR CERVICAL DECOMPRESSION/DISCECTOMY FUSION CERVICAL FOUR/FIVE, CERVICAL FIVE/SIX, CERVICAL SIX/SEVEN  (N/A)  Patient Location: PACU  Anesthesia Type:General  Level of Consciousness: awake  Airway and Oxygen Therapy: Patient Spontanous Breathing  Post-op Pain: mild  Post-op Assessment: Post-op Vital signs reviewed, Patient's Cardiovascular Status Stable, Respiratory Function Stable, Patent Airway, No signs of Nausea or vomiting and Pain level controlled  Post-op Vital Signs: Reviewed and stable  Complications: No apparent anesthesia complications

## 2013-04-17 NOTE — Op Note (Signed)
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  EBL: Minimal  History of present illness: Patient is a 57 year female is a progress worsening neck and bilateral shoulder and arm pain worse on the right brain down to her thumb and forefinger. Workup revealed severe cervical spondylosis kyphosis and stenosis spinal cord compression at C4-5 C5-6 and C6-7. Patient failed all forms of conservative treatment imaging findings progression of clinical syndrome consistent with myelopathy and today conservative treatment I recommended anterior cervical decompression fusion at C4-5, C5-6, C6-7. I extensively reviewed the risks and benefits of the operation with the patient as well as perioperative course and expectations of outcome and alternatives to surgery and she understood and agreed to proceed forward.  Operative procedure: Patient brought into the or was induced under general anesthesia positioned supine the neck in slight extension in 5 pounds of halter traction the right 7 and was prepped and draped in routine sterile fashion preoperative x-ray localize the appropriate level so a curvilinear incision was made just off midline to the interbody of the sternocleidomastoid and the superficial layer of the platysmas dissected and divided longitudinally the avascular the sternomastoid and strap muscles was developed down to the prevertebral fascia. Fascia facet with Kitners. Interoperative x-ray confirmed identification of the C3-4 disc space attention second of 3 disc spaces below this is an annulotomy is were made at  C4-5 C5-6 and C6-7-1 marker the spaces lungs close and reflected laterally and self-retaining retractor was placed. First working at C5-6 and C6-7 disc spaces were incised large anterior aspect of did not Leksell rongeur and 3 mm Kerrison punch the spaces were drilled down capturing the bone shavings in a mucous trap and the microscope illumination first working at C5-6 this was further drilled down was remarkably spondylitic degeneration and collapse. Aggressive undergoing of both endplates allowed identification of the PLL which was removed in piecemeal fashion this significantly decompressed since canal a marking laterally the uncinate process was disarticulated significantly decompressed the C6 nerve roots bilaterally aggressive abutting both endplates further opened up the central canal and Atlantis Kitners no further stenosis either centrally or foraminally at this level then attention taken at C6-7 and a similar fashion C6 and was drilled down capturing the bone shavings in a mucous trap aggressive and viable template allowed of case the PLL which was removed in piecemeal fashion again exposing the thecal sac again both the uncinate processes radicular disarticulated decompress the C7 nerve roots were sent which were skeletonized flush with pedicle. And discectomy is no further stenosis on the foramen or the central canal the spinal cord aggressive and viable template opened up. Then the retractor was repositioned and attention was taken the C4-5. In a similar fashion C4-5 was drilled down capturing the bone shavings in a mucous trap. Aggressive under biting of this posterior endplates allowed decompress the central canal by C5 neuroforamina were opened up and decompressed. All endplates were scraped with a BA curette to 71 mm globus cage was packed with local autograft mixed with DBX and inserted each level respectively and then in last placed in plate 60 mm in size all screws excellent purchase locking  mechanism was gauged fluoroscopy was used the step along the length prior plate placement and additional bone graft and DBX was laid lateral to the cage anteriorly and underneath the plate. Then the locking mechanism plate was applied the postop films look great wound scope see her get meticulous in space was maintained a drain was placed the wounds closed in layers with after Vicryl and the skin was closed with a running 4 subcuticular benzoin staples Steri-Strips were applied and patient recovered in stable condition. At the case on it counts sponge counts were correct.

## 2013-04-17 NOTE — Anesthesia Preprocedure Evaluation (Addendum)
Anesthesia Evaluation  Patient identified by MRN, date of birth, ID band Patient awake    Reviewed: Allergy & Precautions, H&P , NPO status , Patient's Chart, lab work & pertinent test results  Airway       Dental   Pulmonary asthma , Current Smoker,          Cardiovascular + angina + CAD     Neuro/Psych Depression CVA    GI/Hepatic (+)     substance abuse  marijuana use,   Endo/Other    Renal/GU      Musculoskeletal   Abdominal   Peds  Hematology  (+) anemia ,   Anesthesia Other Findings   Reproductive/Obstetrics                          Anesthesia Physical Anesthesia Plan  ASA: III  Anesthesia Plan: General   Post-op Pain Management:    Induction: Intravenous  Airway Management Planned: Oral ETT  Additional Equipment:   Intra-op Plan:   Post-operative Plan: Extubation in OR  Informed Consent: I have reviewed the patients History and Physical, chart, labs and discussed the procedure including the risks, benefits and alternatives for the proposed anesthesia with the patient or authorized representative who has indicated his/her understanding and acceptance.     Plan Discussed with:   Anesthesia Plan Comments:         Anesthesia Quick Evaluation

## 2013-04-17 NOTE — H&P (Signed)
Lacey Wallace is an 57 y.o. female.   Chief Complaint: Neck and right shoulder and arm pain HPI: Patient is a very pleasant 57 year old female is a progress worsening neck pain and bilateral arm pain over last several months and grossly worse. She's experienced numbness tingling or weakness in her hands occasional difficulty opening jars abutting are closed workup has revealed severe spondylitic disease with kyphosis and stenosis at C4-5 C5-6 and C6-7 patient failed all forms of conservative treatment due to previous spinal cord compression a progression of clinical syndrome consistent with myelopathy and her failure conservative treatment I recommended anterior cervical discectomies and fusion at C4-5 C5-6 and C6-7 I extensively reviewed the risks and benefits of the operation with the patient as well as perioperative course expectations about alternatives surgery and she understood and agreed to proceed forward.  Past Medical History  Diagnosis Date  . Anginal pain     "years ago, but it has resolved"  . Stroke     TIA - >30 years ago  . Depression     hospitalized in 1986, no longer needs meds  . Asthma     seasonal, hasnt had any trouble in 3-4 years  . Arthritis     neck, hips, ankles  . Anemia     Past Surgical History  Procedure Laterality Date  . Cardiac catheterization      >30 years  . 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 Tonsillectomy  1977  . Eye surgery  1988    bilateral cataract surgery  . Carpal tunnel release Bilateral 2011  . Appendectomy      ?taken out when she had the kidney surgery  . Dilation and curettage of uterus      3 miscarriages    Family History  Problem Relation Age of Onset  . Scleroderma Mother   . Hypertension Father    Social History:  reports that she has never smoked. She has never used smokeless tobacco. She reports that she drinks alcohol. She reports that she uses illicit drugs (Marijuana).  Allergies:  Allergies  Allergen  Reactions  . Codeine Nausea And Vomiting  . Hydrocodone Nausea And Vomiting    Medications Prior to Admission  Medication Sig Dispense Refill  . calcium carbonate (TUMS - DOSED IN MG ELEMENTAL CALCIUM) 500 MG chewable tablet Chew 2 tablets by mouth daily.      . Multiple Vitamin (MULTIVITAMIN WITH MINERALS) TABS tablet Take 1 tablet by mouth every other day.      . pregabalin (LYRICA) 50 MG capsule Take 50 mg by mouth at bedtime.        Results for orders placed during the hospital encounter of 04/17/13 (from the past 48 hour(s))  CBC     Status: None   Collection Time    04/17/13 11:34 AM      Result Value Range   WBC 8.1  4.0 - 10.5 K/uL   RBC 4.55  3.87 - 5.11 MIL/uL   Hemoglobin 13.6  12.0 - 15.0 g/dL   HCT 84.1  32.4 - 40.1 %   MCV 88.6  78.0 - 100.0 fL   MCH 29.9  26.0 - 34.0 pg   MCHC 33.7  30.0 - 36.0 g/dL   RDW 02.7  25.3 - 66.4 %   Platelets 204  150 - 400 K/uL  BASIC METABOLIC PANEL     Status: Abnormal   Collection Time    04/17/13 11:34 AM  Result Value Range   Sodium 141  135 - 145 mEq/L   Potassium 4.1  3.5 - 5.1 mEq/L   Chloride 106  96 - 112 mEq/L   CO2 25  19 - 32 mEq/L   Glucose, Bld 102 (*) 70 - 99 mg/dL   BUN 19  6 - 23 mg/dL   Creatinine, Ser 1.61 (*) 0.50 - 1.10 mg/dL   Calcium 9.6  8.4 - 09.6 mg/dL   GFR calc non Af Amer 45 (*) >90 mL/min   GFR calc Af Amer 52 (*) >90 mL/min   Comment: (NOTE)     The eGFR has been calculated using the CKD EPI equation.     This calculation has not been validated in all clinical situations.     eGFR's persistently <90 mL/min signify possible Chronic Kidney     Disease.   No results found.  Review of Systems  Constitutional: Negative.   Eyes: Negative.   Respiratory: Negative.   Cardiovascular: Negative.   Gastrointestinal: Negative.   Genitourinary: Negative.   Musculoskeletal: Positive for joint pain, myalgias and neck pain.  Skin: Negative.   Neurological: Positive for tingling and sensory change.   Endo/Heme/Allergies: Negative.   Psychiatric/Behavioral: Negative.     Blood pressure 143/69, pulse 64, temperature 97.2 F (36.2 C), temperature source Oral, resp. rate 18, SpO2 100.00%. Physical Exam  Constitutional: She is oriented to person, place, and time. She appears well-developed and well-nourished.  HENT:  Head: Normocephalic.  Eyes: Pupils are equal, round, and reactive to light.  Neck: Normal range of motion.  Respiratory: Effort normal.  GI: Soft.  Neurological: She is alert and oriented to person, place, and time. She has normal strength. GCS eye subscore is 4. GCS verbal subscore is 5. GCS motor subscore is 6.  Reflex Scores:      Tricep reflexes are 2+ on the right side and 2+ on the left side.      Bicep reflexes are 2+ on the right side and 2+ on the left side.      Brachioradialis reflexes are 2+ on the right side and 2+ on the left side.      Patellar reflexes are 2+ on the right side and 2+ on the left side.      Achilles reflexes are 2+ on the right side and 2+ on the left side. Strength is 5 out of 5 in her deltoids, biceps, triceps, wrist flexion, wrist extension, and intrinsics. Lower extremity strength is also 5 out of 5.  Skin: Skin is warm and dry.     Assessment/Plan 53 56-year-old female present for an ACDF at C4-5, C5-6, C6-7.  Darrol Brandenburg P 04/17/2013, 1:42 PM

## 2013-04-17 NOTE — Anesthesia Procedure Notes (Signed)
Performed by: Luccas Towell D       

## 2013-04-17 NOTE — Preoperative (Signed)
Beta Blockers   Reason not to administer Beta Blockers:Not Applicable 

## 2013-04-18 MED ORDER — CYCLOBENZAPRINE HCL 10 MG PO TABS: 10.0000 mg | ORAL_TABLET | Freq: Three times a day (TID) | ORAL | Status: DC | PRN

## 2013-04-18 MED ORDER — OXYCODONE-ACETAMINOPHEN 5-325 MG PO TABS: 1.0000 | ORAL_TABLET | ORAL | Status: DC | PRN

## 2013-04-18 NOTE — Progress Notes (Signed)
Patient ID: Lacey Wallace, female   DOB: 12-18-1955, 56 y.o.   MRN: [id number] Patient doing very well significant improvement preoperative radicular symptoms swelling is okay wound is clean and dry will discharge home

## 2013-04-18 NOTE — Discharge Summary (Signed)
  Physician Discharge Summary  Patient ID: LAMETRIA KLUNK MRN: [id number] DOB/AGE: [birth year] 58 y.o.  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  Consults: Significant Diagnostic Studies: Treatments: ACDF C4-5 C5-6 C6-7 Discharge Exam: Blood pressure 141/86, pulse 90, temperature 99.6 F (37.6 C), temperature source Oral, resp. rate 20, SpO2 95.00%. Strength out of 5 wound clean and dry  Disposition: Home     Medication List         calcium carbonate 500 MG chewable tablet  Commonly known as:  TUMS - dosed in mg elemental calcium  Chew 2 tablets by mouth daily.     cyclobenzaprine 10 MG tablet  Commonly known as:  FLEXERIL  Take 1 tablet (10 mg total) by mouth 3 (three) times daily as needed for muscle spasms.     multivitamin with minerals Tabs tablet  Take 1 tablet by mouth every other day.     oxyCODONE-acetaminophen 5-325 MG per tablet  Commonly known as:  PERCOCET/ROXICET  Take 1-2 tablets by mouth every 4 (four) hours as needed for moderate pain.     pregabalin 50 MG capsule  Commonly known as:  LYRICA  Take 50 mg by mouth at bedtime.           Follow-up Information   Follow up with [facility] P, MD.   Specialty:  Neurosurgery   Contact information:   [address] [place] [address] [phone]       Signed: Ahlia Lemanski P 04/18/2013, 9:50 AM

## 2013-04-18 NOTE — Progress Notes (Signed)
Pt. discharged home accompanied by husband. Prescriptions and discharge instructions given with verbalization of understanding. Incision site on neck with no s/s of infection - no swelling, redness, bleeding, and/or drainage noted. Soft collar intact. Opportunity given to ask questions but no question asked. Pt. transported out of this unit in wheelchair by the volunteer   

## 2013-04-18 NOTE — Progress Notes (Signed)
UR review completed. 

## 2013-04-19 ENCOUNTER — Encounter (HOSPITAL_COMMUNITY): Payer: Self-pay | Admitting: Neurosurgery

## 2014-08-30 ENCOUNTER — Other Ambulatory Visit: Payer: Self-pay

## 2014-08-30 ENCOUNTER — Encounter (HOSPITAL_COMMUNITY): Payer: Self-pay | Admitting: Emergency Medicine

## 2014-08-30 ENCOUNTER — Emergency Department (HOSPITAL_COMMUNITY)
Admission: EM | Admit: 2014-08-30 | Discharge: 2014-08-30 | Disposition: A | Discharge status: Home / Self Care | Payer: BC Managed Care – PPO | Attending: Emergency Medicine | Admitting: Emergency Medicine

## 2014-08-30 ENCOUNTER — Emergency Department (HOSPITAL_COMMUNITY): Payer: BC Managed Care – PPO

## 2014-08-30 DIAGNOSIS — I209 Angina pectoris, unspecified: Secondary | ICD-10-CM | POA: Insufficient documentation

## 2014-08-30 DIAGNOSIS — I10 Essential (primary) hypertension: Secondary | ICD-10-CM | POA: Diagnosis not present

## 2014-08-30 DIAGNOSIS — M1990 Unspecified osteoarthritis, unspecified site: Secondary | ICD-10-CM | POA: Insufficient documentation

## 2014-08-30 DIAGNOSIS — Z8673 Personal history of transient ischemic attack (TIA), and cerebral infarction without residual deficits: Secondary | ICD-10-CM | POA: Diagnosis not present

## 2014-08-30 DIAGNOSIS — J45909 Unspecified asthma, uncomplicated: Secondary | ICD-10-CM | POA: Insufficient documentation

## 2014-08-30 DIAGNOSIS — R079 Chest pain, unspecified: Secondary | ICD-10-CM | POA: Insufficient documentation

## 2014-08-30 DIAGNOSIS — Z8619 Personal history of other infectious and parasitic diseases: Secondary | ICD-10-CM | POA: Insufficient documentation

## 2014-08-30 DIAGNOSIS — Z862 Personal history of diseases of the blood and blood-forming organs and certain disorders involving the immune mechanism: Secondary | ICD-10-CM | POA: Insufficient documentation

## 2014-08-30 DIAGNOSIS — Z79899 Other long term (current) drug therapy: Secondary | ICD-10-CM | POA: Insufficient documentation

## 2014-08-30 DIAGNOSIS — R110 Nausea: Secondary | ICD-10-CM | POA: Insufficient documentation

## 2014-08-30 DIAGNOSIS — Z9889 Other specified postprocedural states: Secondary | ICD-10-CM | POA: Diagnosis not present

## 2014-08-30 HISTORY — DX: Essential (primary) hypertension: I10

## 2014-08-30 LAB — BASIC METABOLIC PANEL
ANION GAP: 10 (ref 5–15)
BUN: 19 mg/dL (ref 6–23)
CALCIUM: 9.2 mg/dL (ref 8.4–10.5)
CO2: 25 mmol/L (ref 19–32)
Chloride: 103 mmol/L (ref 96–112)
Creatinine, Ser: 1.48 mg/dL — ABNORMAL HIGH (ref 0.50–1.10)
GFR calc Af Amer: 44 mL/min — ABNORMAL LOW (ref >90)
GFR calc non Af Amer: 38 mL/min — ABNORMAL LOW (ref >90)
Glucose, Bld: 126 mg/dL — ABNORMAL HIGH (ref 70–99)
POTASSIUM: 4.2 mmol/L (ref 3.5–5.1)
SODIUM: 138 mmol/L (ref 135–145)

## 2014-08-30 LAB — I-STAT TROPONIN, ED: Troponin i, poc: 0 ng/mL (ref 0.00–0.08)

## 2014-08-30 LAB — BRAIN NATRIURETIC PEPTIDE: B Natriuretic Peptide: 22.2 pg/mL (ref 0.0–100.0)

## 2014-08-30 MED ORDER — ASPIRIN 325 MG PO TABS: 325.0000 mg | ORAL_TABLET | ORAL | Status: DC

## 2014-08-30 MED ORDER — ASPIRIN 81 MG PO CHEW
324.0000 mg | CHEWABLE_TABLET | Freq: Once | ORAL | Status: DC
  Filled 2014-08-30: qty 4

## 2014-08-30 NOTE — ED Provider Notes (Signed)
CSN: [id number]     Arrival date & time 08/30/14  0101 History   First MD Initiated Contact with Patient 08/30/14 0143     Chief Complaint  Patient presents with  . Chest Pain     (Consider location/radiation/quality/duration/timing/severity/associated sxs/prior Treatment) Patient is a 59 y.o. female presenting with chest pain.  Chest Pain Pain location:  Substernal area Pain quality: pressure   Pain radiates to:  Does not radiate Pain radiates to the back: no   Pain severity:  Moderate Onset quality:  Gradual Duration:  8 hours Timing:  Constant Progression:  Partially resolved Chronicity:  Recurrent Context comment:  After standing up with child in arms Relieved by:  Nothing Worsened by:  Exertion, deep breathing and movement Associated symptoms: nausea   Associated symptoms: no abdominal pain, no dizziness, no headache, no shortness of breath, no syncope and not vomiting   Associated symptoms comment:  Neck, back, and arm pain   Past Medical History  Diagnosis Date  . Anginal pain     "years ago, but it has resolved"  . Stroke     TIA - >30 years ago  . Depression     hospitalized in 1986, no longer needs meds  . Asthma     seasonal, hasnt had any trouble in 3-4 years  . Arthritis     neck, hips, ankles  . Anemia   . Hypertension    Past Surgical History  Procedure Laterality Date  . Cardiac catheterization      >30 years  . 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 Tonsillectomy  1977  . Eye surgery  1988    bilateral cataract surgery  . Carpal tunnel release Bilateral 2011  . Appendectomy      ?taken out when she had the kidney surgery  . Dilation and curettage of uterus      3 miscarriages  . Anterior cervical decomp/discectomy fusion N/A 04/17/2013    Procedure: ANTERIOR CERVICAL DECOMPRESSION/DISCECTOMY FUSION CERVICAL FOUR/FIVE, CERVICAL FIVE/SIX, CERVICAL SIX/SEVEN ;  Surgeon: Elaina Hoops, MD;  Location: [facility];  Service: Neurosurgery;   Laterality: N/A;   Family History  Problem Relation Age of Onset  . Scleroderma Mother   . Hypertension Father    History  Substance Use Topics  . Smoking status: Never Smoker   . Smokeless tobacco: Never Used  . Alcohol Use: Yes     Comment: social/wine   OB History    No data available     Review of Systems  Respiratory: Negative for shortness of breath.   Cardiovascular: Positive for chest pain. Negative for syncope.  Gastrointestinal: Positive for nausea. Negative for vomiting and abdominal pain.  Neurological: Negative for dizziness and headaches.  All other systems reviewed and are negative.     Allergies  Other; Codeine; and Hydrocodone  Home Medications   Prior to Admission medications   Medication Sig Start Date End Date Taking? Authorizing Provider  albuterol (PROVENTIL HFA;VENTOLIN HFA) 108 (90 BASE) MCG/ACT inhaler Inhale 1 puff into the lungs every 6 (six) hours as needed for wheezing or shortness of breath.   Yes Historical Provider, MD  calcium carbonate (TUMS - DOSED IN MG ELEMENTAL CALCIUM) 500 MG chewable tablet Chew 2 tablets by mouth daily.    Historical Provider, MD  cyclobenzaprine (FLEXERIL) 10 MG tablet Take 1 tablet (10 mg total) by mouth 3 (three) times daily as needed for muscle spasms. Patient not taking: Reported on 08/30/2014  04/18/13   Kary Kos, MD  Multiple Vitamin (MULTIVITAMIN WITH MINERALS) TABS tablet Take 1 tablet by mouth every other day.    Historical Provider, MD  oxyCODONE-acetaminophen (PERCOCET/ROXICET) 5-325 MG per tablet Take 1-2 tablets by mouth every 4 (four) hours as needed for moderate pain. Patient not taking: Reported on 08/30/2014 04/18/13   Kary Kos, MD  pregabalin (LYRICA) 50 MG capsule Take 50 mg by mouth at bedtime.    Historical Provider, MD   BP 153/88 mmHg  Pulse 88  Resp 18  SpO2 99% Physical Exam  Constitutional: She is oriented to person, place, and time. She appears well-developed and well-nourished.  HENT:   Head: Normocephalic and atraumatic.  Right Ear: External ear normal.  Left Ear: External ear normal.  Eyes: Conjunctivae and EOM are normal. Pupils are equal, round, and reactive to light.  Neck: Normal range of motion. Neck supple.  Cardiovascular: Normal rate, regular rhythm, normal heart sounds and intact distal pulses.   Pulmonary/Chest: Effort normal and breath sounds normal.  Abdominal: Soft. Bowel sounds are normal. There is no tenderness.  Musculoskeletal: Normal range of motion.       Cervical back: She exhibits tenderness and bony tenderness.       Thoracic back: Normal.       Lumbar back: Normal.  Neurological: She is alert and oriented to person, place, and time. She has normal strength and normal reflexes. No cranial nerve deficit or sensory deficit. GCS eye subscore is 4. GCS verbal subscore is 5. GCS motor subscore is 6.  Skin: Skin is warm and dry.  Vitals reviewed.   ED Course  Procedures (including critical care time) Labs Review Labs Reviewed  BASIC METABOLIC PANEL - Abnormal; Notable for the following:    Glucose, Bld 126 (*)    Creatinine, Ser 1.48 (*)    GFR calc non Af Amer 38 (*)    GFR calc Af Amer 44 (*)    All other components within normal limits  BRAIN NATRIURETIC PEPTIDE  CBC  I-STAT TROPOININ, ED    Imaging Review Dg Chest 2 View  08/30/2014   CLINICAL DATA:  Sternal chest pain radiating into right neck and arm.  EXAM: CHEST  2 VIEW  COMPARISON:  None.  FINDINGS: The cardiomediastinal contours are normal. The lungs are clear. Pulmonary vasculature is normal. No consolidation, pleural effusion, or pneumothorax. No acute osseous abnormalities are seen. Fusion hardware in the cervical spine, partially included.  IMPRESSION: No acute pulmonary process.   Electronically Signed   By: Jeb Levering M.D.   On: 08/30/2014 02:48     EKG Interpretation   Date/Time:  Saturday August 30 2014 03:13:13 EDT Ventricular Rate:  72 PR Interval:  183 QRS  Duration: 92 QT Interval:  409 QTC Calculation: 448 R Axis:   55 Text Interpretation:  Sinus rhythm RSR' in V1 or V2, right VCD or RVH No  significant change since last tracing Confirmed by Debby Freiberg [phone])  on 08/30/2014 3:54:57 AM      MDM   Final diagnoses:  Chest pain, unspecified chest pain type    59 y.o. female with pertinent PMH of prior anterior cervical discectomy presents with chest pain as above, along with neck, back, and leg pain (R) after standing up with child in her arm.  Pt states she was running, dyspneic during, then after picking up her grandchild began to have pain.  Physical exam on arrival as above.  Symptoms constant for 8 hours at  time of my exam.    Wu unremarkable.  Likely symptoms related to cervical radiculopathy.  Will have pt fu with pcp.  DC home in stable condition.  I have reviewed all laboratory and imaging studies if ordered as above  1. Chest pain, unspecified chest pain type         Debby Freiberg, MD 08/30/14 [phone]

## 2014-08-30 NOTE — ED Notes (Signed)
Pt reports onset of sharp chest pain around 630 pm last night. Pt states it now feels like there is something sitting on her chest. Pt states she becomes SOB and diaphoretic with chest pain. Pt seen at [facility] for same and was told she could have anxiety. Pt alert and oriented in triage.

## 2014-08-30 NOTE — Discharge Instructions (Signed)
Chest Pain (Nonspecific) °It is often hard to give a specific diagnosis for the cause of chest pain. There is always a chance that your pain could be related to something serious, such as a heart attack or a blood clot in the lungs. You need to follow up with your health care provider for further evaluation. °CAUSES  °· Heartburn. °· Pneumonia or bronchitis. °· Anxiety or stress. °· Inflammation around your heart (pericarditis) or lung (pleuritis or pleurisy). °· A blood clot in the lung. °· A collapsed lung (pneumothorax). It can develop suddenly on its own (spontaneous pneumothorax) or from trauma to the chest. °· Shingles infection (herpes zoster virus). °The chest wall is composed of bones, muscles, and cartilage. Any of these can be the source of the pain. °· The bones can be bruised by injury. °· The muscles or cartilage can be strained by coughing or overwork. °· The cartilage can be affected by inflammation and become sore (costochondritis). °DIAGNOSIS  °Lab tests or other studies may be needed to find the cause of your pain. Your health care provider may have you take a test called an ambulatory electrocardiogram (ECG). An ECG records your heartbeat patterns over a 24-hour period. You may also have other tests, such as: °· Transthoracic echocardiogram (TTE). During echocardiography, sound waves are used to evaluate how blood flows through your heart. °· Transesophageal echocardiogram (TEE). °· Cardiac monitoring. This allows your health care provider to monitor your heart rate and rhythm in real time. °· Holter monitor. This is a portable device that records your heartbeat and can help diagnose heart arrhythmias. It allows your health care provider to track your heart activity for several days, if needed. °· Stress tests by exercise or by giving medicine that makes the heart beat faster. °TREATMENT  °· Treatment depends on what may be causing your chest pain. Treatment may include: °· Acid blockers for  heartburn. °· Anti-inflammatory medicine. °· Pain medicine for inflammatory conditions. °· Antibiotics if an infection is present. °· You may be advised to change lifestyle habits. This includes stopping smoking and avoiding alcohol, caffeine, and chocolate. °· You may be advised to keep your head raised (elevated) when sleeping. This reduces the chance of acid going backward from your stomach into your esophagus. °Most of the time, nonspecific chest pain will improve within 2-3 days with rest and mild pain medicine.  °HOME CARE INSTRUCTIONS  °· If antibiotics were prescribed, take them as directed. Finish them even if you start to feel better. °· For the next few days, avoid physical activities that bring on chest pain. Continue physical activities as directed. °· Do not use any tobacco products, including cigarettes, chewing tobacco, or electronic cigarettes. °· Avoid drinking alcohol. °· Only take medicine as directed by your health care provider. °· Follow your health care provider's suggestions for further testing if your chest pain does not go away. °· Keep any follow-up appointments you made. If you do not go to an appointment, you could develop lasting (chronic) problems with pain. If there is any problem keeping an appointment, call to reschedule. °SEEK MEDICAL CARE IF:  °· Your chest pain does not go away, even after treatment. °· You have a rash with blisters on your chest. °· You have a fever. °SEEK IMMEDIATE MEDICAL CARE IF:  °· You have increased chest pain or pain that spreads to your arm, neck, jaw, back, or abdomen. °· You have shortness of breath. °· You have an increasing cough, or you cough   up blood.  You have severe back or abdominal pain.  You feel nauseous or vomit.  You have severe weakness.  You faint.  You have chills. This is an emergency. Do not wait to see if the pain will go away. Get medical help at once. Call your local emergency services (911 in [place] Do not drive  yourself to the hospital. MAKE SURE YOU:   Understand these instructions.  Will watch your condition.  Will get help right away if you are not doing well or get worse. Document Released: 02/09/2005 Document Revised: 05/07/2013 Document Reviewed: 12/06/2007 [facility] Patient Information 2015 [place], [place]. This information is not intended to replace advice given to you by your health care provider. Make sure you discuss any questions you have with your health care provider. Back Pain, Adult Low back pain is very common. About 1 in 5 people have back pain.The cause of low back pain is rarely dangerous. The pain often gets better over time.About half of people with a sudden onset of back pain feel better in just 2 weeks. About 8 in 10 people feel better by 6 weeks.  CAUSES Some common causes of back pain include:  Strain of the muscles or ligaments supporting the spine.  Wear and tear (degeneration) of the spinal discs.  Arthritis.  Direct injury to the back. DIAGNOSIS Most of the time, the direct cause of low back pain is not known.However, back pain can be treated effectively even when the exact cause of the pain is unknown.Answering your caregiver's questions about your overall health and symptoms is one of the most accurate ways to make sure the cause of your pain is not dangerous. If your caregiver needs more information, he or she may order lab work or imaging tests (X-rays or MRIs).However, even if imaging tests show changes in your back, this usually does not require surgery. HOME CARE INSTRUCTIONS For many people, back pain returns.Since low back pain is rarely dangerous, it is often a condition that people can learn to [facility] their own.   Remain active. It is stressful on the back to sit or stand in one place. Do not sit, drive, or stand in one place for more than 30 minutes at a time. Take short walks on level surfaces as soon as pain allows.Try to increase the length of  time you walk each day.  Do not stay in bed.Resting more than 1 or 2 days can delay your recovery.  Do not avoid exercise or work.Your body is made to move.It is not dangerous to be active, even though your back may hurt.Your back will likely heal faster if you return to being active before your pain is gone.  Pay attention to your body when you bend and lift. Many people have less discomfortwhen lifting if they bend their knees, keep the load close to their bodies,and avoid twisting. Often, the most comfortable positions are those that put less stress on your recovering back.  Find a comfortable position to sleep. Use a firm mattress and lie on your side with your knees slightly bent. If you lie on your back, put a pillow under your knees.  Only take over-the-counter or prescription medicines as directed by your caregiver. Over-the-counter medicines to reduce pain and inflammation are often the most helpful.Your caregiver may prescribe muscle relaxant drugs.These medicines help dull your pain so you can more quickly return to your normal activities and healthy exercise.  Put ice on the injured area.  Put ice in  a plastic bag.  Place a towel between your skin and the bag.  Leave the ice on for 15-20 minutes, 03-04 times a day for the first 2 to 3 days. After that, ice and heat may be alternated to reduce pain and spasms.  Ask your caregiver about trying back exercises and gentle massage. This may be of some benefit.  Avoid feeling anxious or stressed.Stress increases muscle tension and can worsen back pain.It is important to recognize when you are anxious or stressed and learn ways to manage it.Exercise is a great option. SEEK MEDICAL CARE IF:  You have pain that is not relieved with rest or medicine.  You have pain that does not improve in 1 week.  You have new symptoms.  You are generally not feeling well. SEEK IMMEDIATE MEDICAL CARE IF:   You have pain that radiates  from your back into your legs.  You develop new bowel or bladder control problems.  You have unusual weakness or numbness in your arms or legs.  You develop nausea or vomiting.  You develop abdominal pain.  You feel faint. Document Released: 05/02/2005 Document Revised: 11/01/2011 Document Reviewed: 09/03/2013 [facility] Patient Information 2015 [place], [place]. This information is not intended to replace advice given to you by your health care provider. Make sure you discuss any questions you have with your health care provider.

## 2014-12-02 ENCOUNTER — Encounter: Payer: Self-pay | Admitting: Gastroenterology

## 2015-03-13 ENCOUNTER — Encounter: Payer: Self-pay | Admitting: Cardiology

## 2015-05-19 ENCOUNTER — Other Ambulatory Visit: Payer: Self-pay | Admitting: Physician Assistant

## 2015-05-19 DIAGNOSIS — R52 Pain, unspecified: Secondary | ICD-10-CM

## 2015-05-20 ENCOUNTER — Ambulatory Visit
Admission: RE | Admit: 2015-05-20 | Discharge: 2015-05-20 | Disposition: A | Discharge status: Home / Self Care | Payer: BC Managed Care – PPO | Source: Ambulatory Visit | Attending: Family Medicine | Admitting: Family Medicine

## 2015-05-20 DIAGNOSIS — R52 Pain, unspecified: Secondary | ICD-10-CM

## 2015-06-10 ENCOUNTER — Other Ambulatory Visit (HOSPITAL_COMMUNITY)
Admission: RE | Admit: 2015-06-10 | Discharge: 2015-06-10 | Disposition: A | Discharge status: Home / Self Care | Payer: BC Managed Care – PPO | Source: Ambulatory Visit | Attending: Family Medicine | Admitting: Family Medicine

## 2015-06-10 ENCOUNTER — Other Ambulatory Visit: Payer: Self-pay | Admitting: Physician Assistant

## 2015-06-10 DIAGNOSIS — Z124 Encounter for screening for malignant neoplasm of cervix: Secondary | ICD-10-CM | POA: Diagnosis not present

## 2015-06-12 LAB — CYTOLOGY - PAP

## 2017-04-05 IMAGING — US US EXTREM LOW VENOUS*L*
1 series · 14 of 24 positions shown · non-contrast
Comparison: None.

CLINICAL DATA: Left knee pain for 1 week

EXAM:
LEFT LOWER EXTREMITY VENOUS DUPLEX ULTRASOUND
TECHNIQUE: Doppler venous assessment of the left lower extremity deep venous
system was performed, including characterization of spectral flow,
compressibility, and phasicity.

[Series 1: us extrem low venous*left* · 14 of 29 slices shown]
[im 1/29]
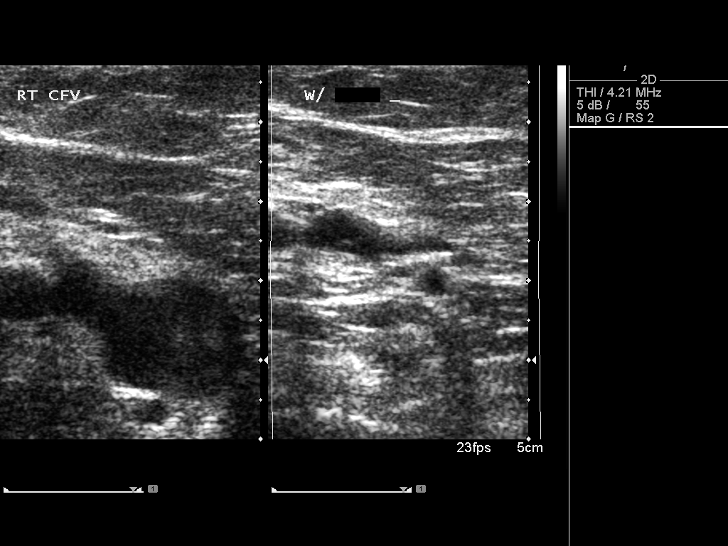
[im 3/29]
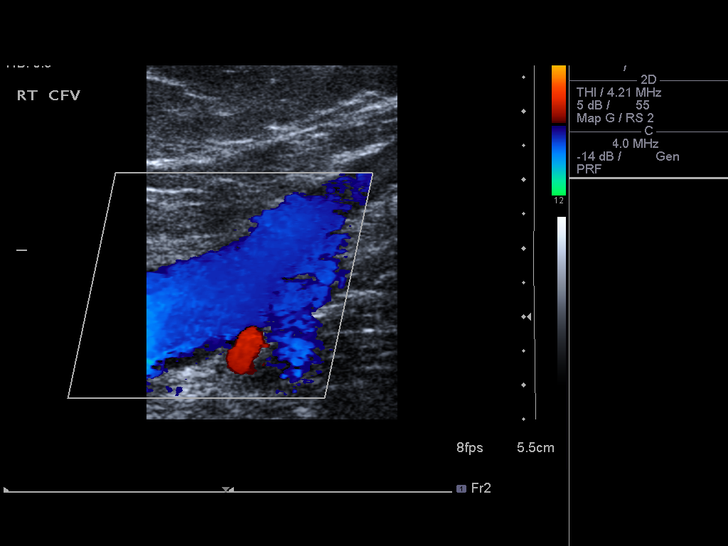
[im 5/29]
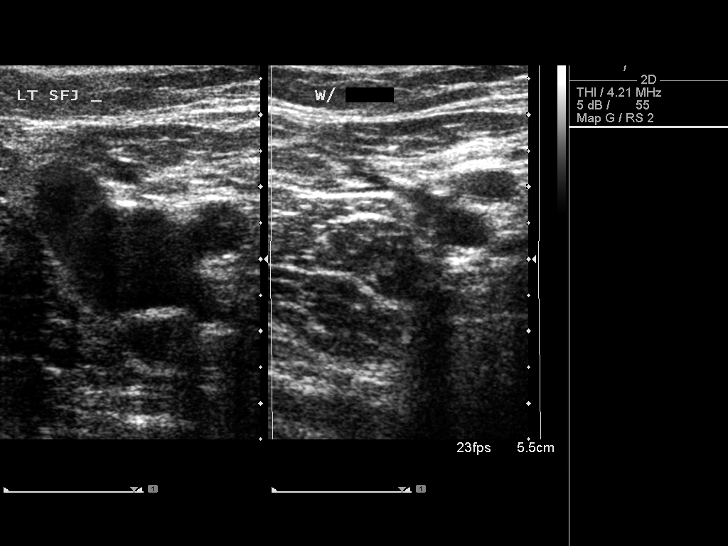
[im 8/29]
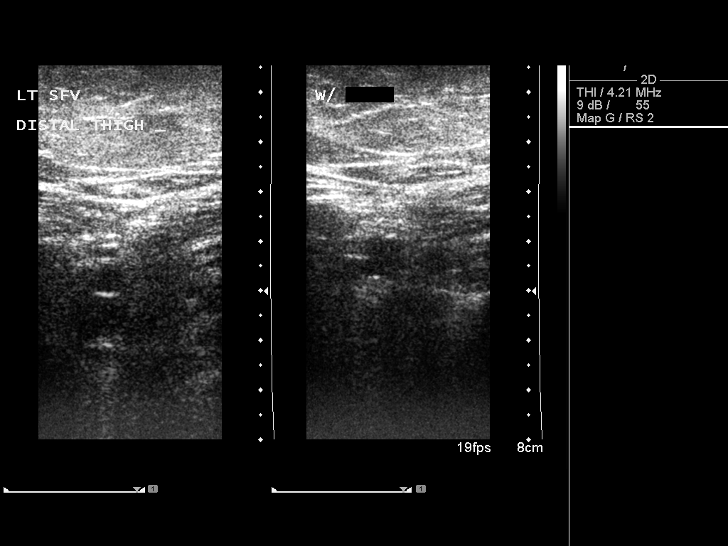
[im 9/29]
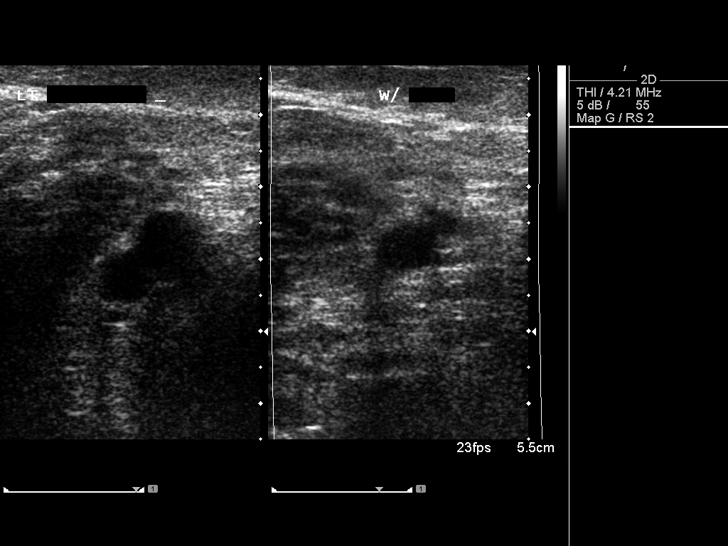
[im 11/29]
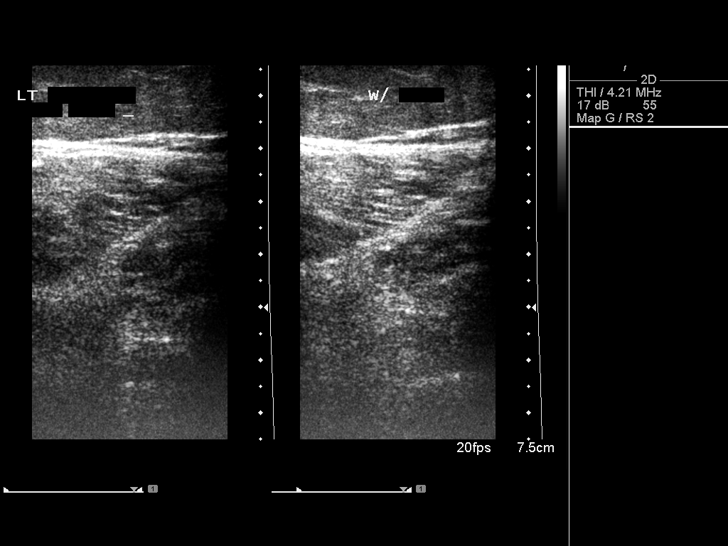
[im 14/29]
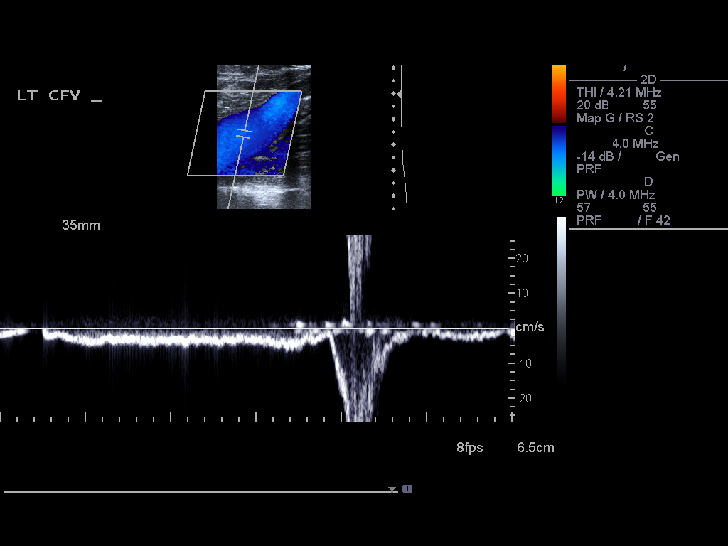
[im 15/29]
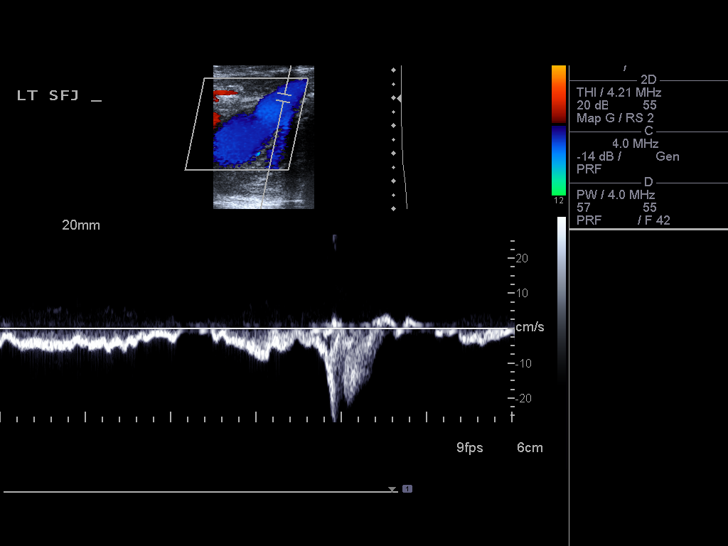
[im 18/29]
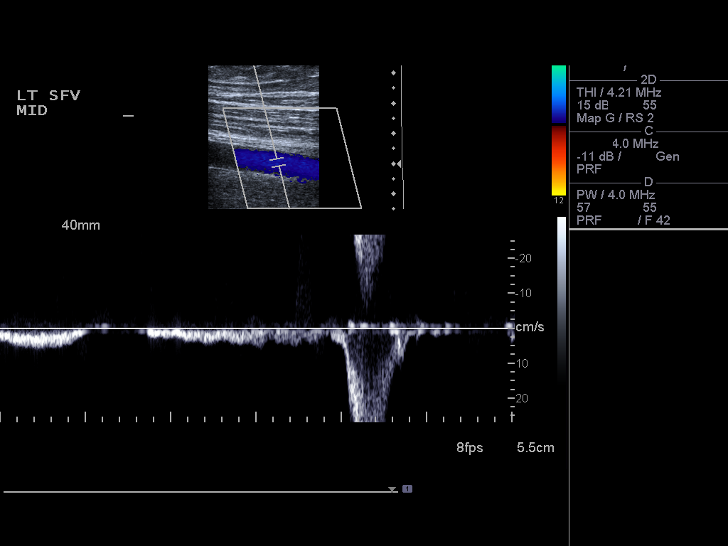
[im 20/29]
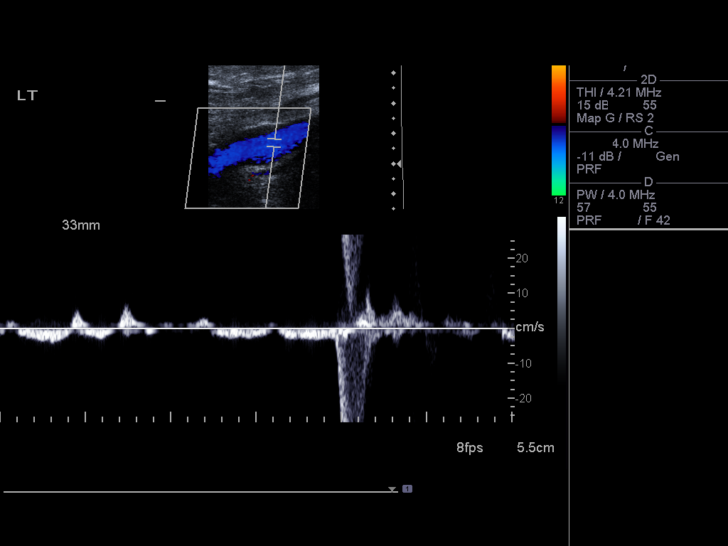
[im 22/29]
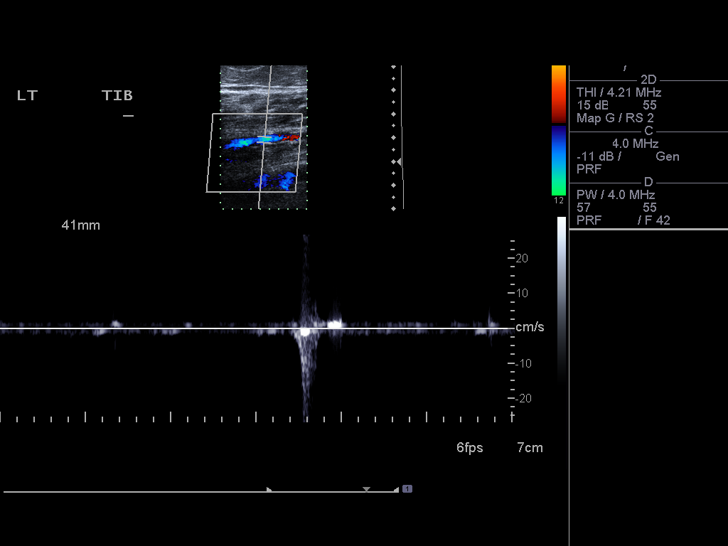
[im 24/29]
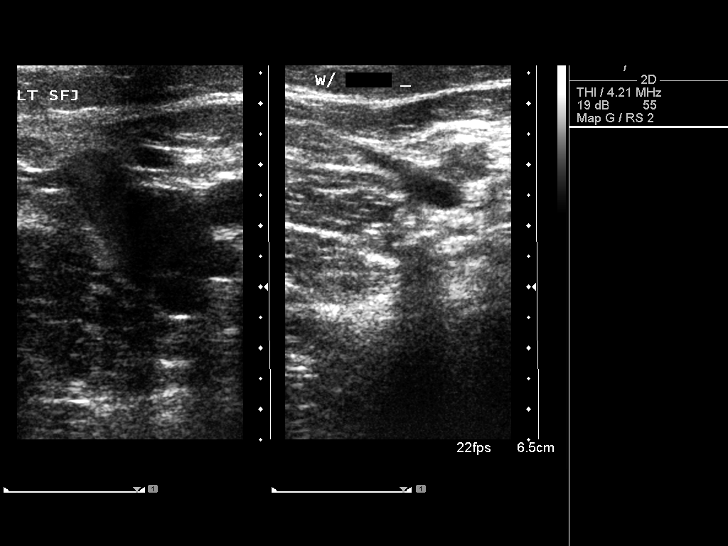
[im 26/29]
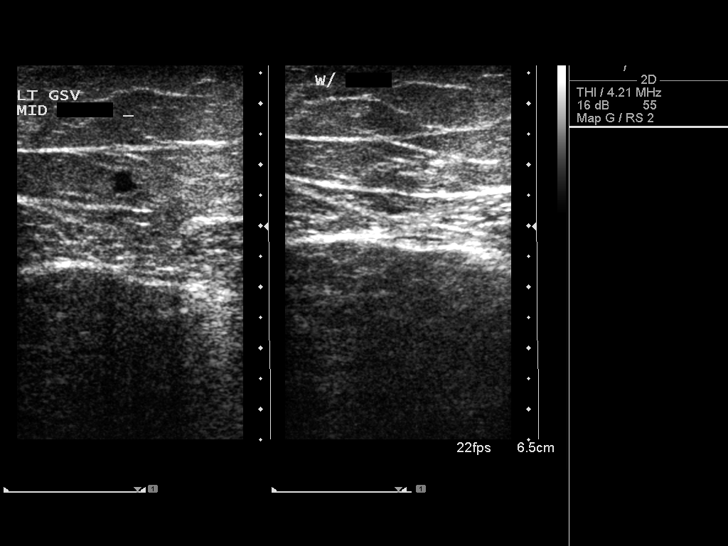
[im 29/29]
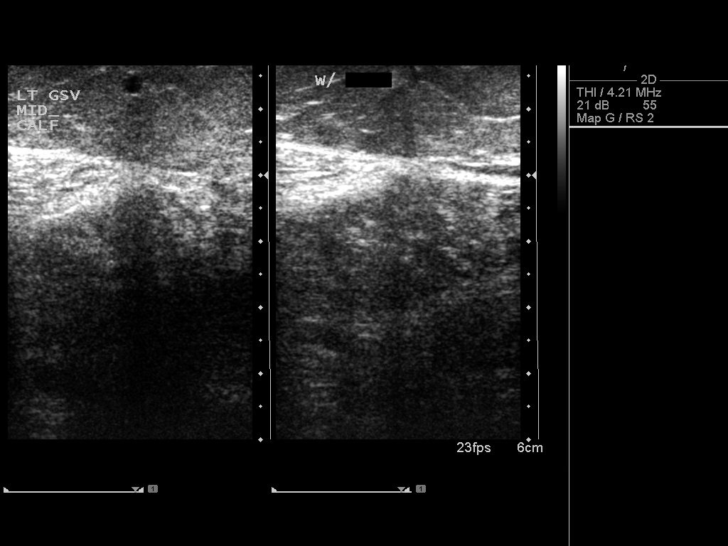

[14 of 24 positions shown; findings below may reference images not displayed]

FINDINGS: There is complete compressibility of the left common femoral,
femoral, and popliteal veins. Doppler analysis demonstrates
respiratory phasicity and augmentation of flow upon calf
compression. No evidence of superficial vein or calf vein
thrombosis.
IMPRESSION: No evidence of left lower extremity DVT.

## 2019-12-02 ENCOUNTER — Encounter: Payer: Self-pay | Admitting: Cardiology

## 2019-12-02 ENCOUNTER — Ambulatory Visit: Payer: BC Managed Care – PPO | Admitting: Cardiology

## 2019-12-02 ENCOUNTER — Other Ambulatory Visit: Payer: Self-pay

## 2019-12-02 VITALS — BP 146/76 | HR 72 | Resp 17 | Ht 68.0 in | Wt 251.0 lb

## 2019-12-02 DIAGNOSIS — R0609 Other forms of dyspnea: Secondary | ICD-10-CM | POA: Insufficient documentation

## 2019-12-02 DIAGNOSIS — E0590 Thyrotoxicosis, unspecified without thyrotoxic crisis or storm: Secondary | ICD-10-CM | POA: Insufficient documentation

## 2019-12-02 NOTE — Progress Notes (Signed)
Patient referred by Michel Harrow, PA-C for dyspnea on exertion  Subjective:   Lacey Wallace, female    DOB: 1956/04/26, 64 y.o.   MRN: [id number]   Chief Complaint  Patient presents with  . Shortness of Breath  . New Patient (Initial Visit)  . Fatigue     HPI  64 y/o Caucasian female with hypertension, CKD 3, hyperthyroidism, elevated ESR, CRP, dyspnea on exertion  Patient is a retired Pharmacist, hospital. Since February 2021, she has experienced exertional dyspnea, polyarthralgia. She denies chest pain, palpitations, leg edema, orthopnea, PND. Workup has been significant for elevated ESR< CRP, and very low TSH with increased T3 uptake. She has appts scheduled with endocrinology and rheumatology.   Blood pressure is elevated today, but usually is well controlled.   Past Medical History:  Diagnosis Date  . Anemia   . Anginal pain ([place])    "years ago, but it has resolved"  . Anxiety disorder   . Arthritis    neck, hips, ankles  . Asthma   . Asthma    seasonal, hasnt had any trouble in 3-4 years  . CAD (coronary artery disease)   . Colon polyps    adennomatous  . Depression   . Depression    hospitalized in 1986, no longer needs meds  . GI bleed   . HLD (hyperlipidemia)   . Hypertension   . Obesity   . Sleep apnea   . Stricture and stenosis of esophagus   . Stroke [facility])    TIA - >30 years ago  . UTI (urinary tract infection)      Past Surgical History:  Procedure Laterality Date  . ANTERIOR CERVICAL DECOMP/DISCECTOMY FUSION N/A 04/17/2013   Procedure: ANTERIOR CERVICAL DECOMPRESSION/DISCECTOMY FUSION CERVICAL FOUR/FIVE, CERVICAL FIVE/SIX, CERVICAL SIX/SEVEN ;  Surgeon: Elaina Hoops, MD;  Location: [facility];  Service: Neurosurgery;  Laterality: N/A;  . APPENDECTOMY     ?taken out when she had the kidney surgery  . CARDIAC CATHETERIZATION     >30 years  . CARPAL TUNNEL RELEASE Bilateral 2011  . CATARACT EXTRACTION    . DILATION AND CURETTAGE OF UTERUS     3  miscarriages  . EYE SURGERY  1988   bilateral cataract surgery  . re-implanted ureters     as a child  . remiplant ureters    . TONSILLECTOMY    . TONSILLECTOMY  1977     Social History   Tobacco Use  Smoking Status Never Smoker  Smokeless Tobacco Never Used    Social History   Substance and Sexual Activity  Alcohol Use Yes   Comment: social/wine     Family History  Problem Relation Age of Onset  . Breast cancer Unknown        GM  . Colon polyps Unknown        GF  . Scleroderma Mother   . Hypertension Father      Current Outpatient Medications on File Prior to Visit  Medication Sig Dispense Refill  . albuterol (PROVENTIL HFA) 108 (90 BASE) MCG/ACT inhaler Inhale 2 puffs into the lungs every 6 (six) hours as needed.      Marland Kitchen albuterol (PROVENTIL HFA;VENTOLIN HFA) 108 (90 BASE) MCG/ACT inhaler Inhale 1 puff into the lungs every 6 (six) hours as needed for wheezing or shortness of breath.    . calcium carbonate (TUMS - DOSED IN MG ELEMENTAL CALCIUM) 500 MG chewable tablet Chew 2 tablets by mouth daily.    Marland Kitchen  cyclobenzaprine (FLEXERIL) 10 MG tablet Take 1 tablet (10 mg total) by mouth 3 (three) times daily as needed for muscle spasms. (Patient not taking: Reported on 08/30/2014) 80 tablet 0  . famotidine (PEPCID) 40 MG tablet Take 40 mg by mouth daily.      . Multiple Vitamin (MULTIVITAMIN WITH MINERALS) TABS tablet Take 1 tablet by mouth every other day.    . Multiple Vitamin (MULTIVITAMIN) tablet Take 1 tablet by mouth daily.      . nitroGLYCERIN (NITROSTAT) 0.4 MG SL tablet Place 0.4 mg under the tongue every 5 (five) minutes as needed.      Marland Kitchen oxyCODONE-acetaminophen (PERCOCET/ROXICET) 5-325 MG per tablet Take 1-2 tablets by mouth every 4 (four) hours as needed for moderate pain. (Patient not taking: Reported on 08/30/2014) 80 tablet 0  . pregabalin (LYRICA) 50 MG capsule Take 50 mg by mouth at bedtime.    . pseudoephedrine (SUDAFED) 30 MG tablet Take 30 mg by mouth every 4  (four) hours as needed.       No current facility-administered medications on file prior to visit.    Cardiovascular and other pertinent studies:  EKG 12/02/2019: Sinus rhythm 66 bpm  RSR(V1) -nondiagnostic  Left atrial enlargement    Recent labs: 11/26/2019: Glucose 23, BUN/Cr 22/1.57. EGFR 34. Na/K 142/4.7.  ESR 4 (<30), CRP 17 (<5) HbA1C 5.7% TSH <0.01 (very low), free T4 10 normal, T3 uptake 46 (32-45)  01/2019: Chol 213, TG 107, HDL 52, LDL 140   Review of Systems  Cardiovascular: Positive for dyspnea on exertion. Negative for chest pain, leg swelling, palpitations and syncope.  Musculoskeletal: Positive for joint pain.         Vitals:   12/02/19 1003  BP: (!) 146/76  Pulse: 72  Resp: 17  SpO2: 97%     Body mass index is 38.16 kg/m. Filed Weights   12/02/19 1003  Weight: 251 lb (113.9 kg)     Objective:   Physical Exam Vitals and nursing note reviewed.  Constitutional:      General: She is not in acute distress. Neck:     Vascular: No JVD.  Cardiovascular:     Rate and Rhythm: Normal rate and regular rhythm.     Heart sounds: Normal heart sounds. No murmur heard.   Pulmonary:     Effort: Pulmonary effort is normal.     Breath sounds: Normal breath sounds. No wheezing or rales.            Assessment & Recommendations:   64 y/o Caucasian female with hypertension, CKD 3, hyperthyroidism, elevated ESR, CRP, dyspnea on exertion  Dyspnea on exertion: Normal baseline physical exam, EKG, and echocardiogram.  Will obtain echocardiogram, but will hold off stress testing till further workup and management of hyperthyroid state. I do suspect her symptoms may be related to hyperthyroid state.   Hyperlipidemia: ASCVD risk 7.5%. Discussed diet and lifestyle modification. Repeat lipid panel with PCP after management of hyperthyroid state.    Thank you for referring the patient to [place]. Please feel free to contact with any questions.  Nigel Mormon, MD [facility] Pager: [phone] Office: [phone]

## 2019-12-04 ENCOUNTER — Other Ambulatory Visit: Payer: Self-pay | Admitting: Nephrology

## 2019-12-04 DIAGNOSIS — N1832 Chronic kidney disease, stage 3b: Secondary | ICD-10-CM

## 2019-12-05 ENCOUNTER — Ambulatory Visit: Payer: BC Managed Care – PPO

## 2019-12-05 ENCOUNTER — Other Ambulatory Visit: Payer: Self-pay

## 2019-12-05 DIAGNOSIS — R0609 Other forms of dyspnea: Secondary | ICD-10-CM

## 2019-12-11 ENCOUNTER — Telehealth: Payer: Self-pay

## 2019-12-11 NOTE — Telephone Encounter (Signed)
Tried to contact patient her but VM is full and can not accept messages.

## 2019-12-11 NOTE — Telephone Encounter (Signed)
Tried to contact patient her but VM is full and can not accept messages. X2 call

## 2019-12-11 NOTE — Telephone Encounter (Signed)
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 Mild leakiness of mitral and tricuspid valves. These are benign findings and do not explain the shortness of breath. However, they do need monitoring. Recommend f/u w/me in 3 months.   Thanks MJP

## 2019-12-13 NOTE — Telephone Encounter (Signed)
Unable to contact patient VM is full   °

## 2020-02-13 ENCOUNTER — Ambulatory Visit
Admission: RE | Admit: 2020-02-13 | Discharge: 2020-02-13 | Disposition: A | Discharge status: Home / Self Care | Payer: Medicare PPO | Source: Ambulatory Visit | Attending: Nephrology | Admitting: Nephrology

## 2020-02-13 DIAGNOSIS — N1832 Chronic kidney disease, stage 3b: Secondary | ICD-10-CM

## 2020-04-21 ENCOUNTER — Encounter: Payer: Self-pay | Admitting: Gastroenterology

## 2020-06-04 ENCOUNTER — Ambulatory Visit (AMBULATORY_SURGERY_CENTER): Payer: Self-pay

## 2020-06-04 ENCOUNTER — Other Ambulatory Visit: Payer: Self-pay

## 2020-06-04 VITALS — Ht 68.0 in | Wt 258.0 lb

## 2020-06-04 DIAGNOSIS — Z1211 Encounter for screening for malignant neoplasm of colon: Secondary | ICD-10-CM

## 2020-06-04 MED ORDER — SUTAB 1479-225-188 MG PO TABS: 1.0000 | ORAL_TABLET | ORAL | 0 refills | Status: DC

## 2020-06-04 NOTE — Progress Notes (Signed)

## 2020-06-10 ENCOUNTER — Encounter: Payer: Self-pay | Admitting: Gastroenterology

## 2020-06-18 ENCOUNTER — Other Ambulatory Visit: Payer: Self-pay

## 2020-06-18 ENCOUNTER — Ambulatory Visit (AMBULATORY_SURGERY_CENTER): Payer: Medicare PPO | Admitting: Gastroenterology

## 2020-06-18 ENCOUNTER — Encounter: Payer: Self-pay | Admitting: Gastroenterology

## 2020-06-18 VITALS — BP 105/58 | HR 56 | Resp 24 | Ht 68.0 in | Wt 258.0 lb

## 2020-06-18 DIAGNOSIS — Z1211 Encounter for screening for malignant neoplasm of colon: Secondary | ICD-10-CM | POA: Diagnosis not present

## 2020-06-18 DIAGNOSIS — D1339 Benign neoplasm of other parts of small intestine: Secondary | ICD-10-CM | POA: Diagnosis not present

## 2020-06-18 DIAGNOSIS — K6389 Other specified diseases of intestine: Secondary | ICD-10-CM

## 2020-06-18 MED ORDER — SODIUM CHLORIDE 0.9 % IV SOLN: 500.0000 mL | INTRAVENOUS | Status: DC

## 2020-06-18 NOTE — Patient Instructions (Signed)
Read all of the handouts given to you by your recovery room nurse.  Be sure to read the diet area.  YOU HAD AN ENDOSCOPIC PROCEDURE TODAY AT [facility]:   Refer to the procedure report that was given to you for any specific questions about what was found during the examination.  If the procedure report does not answer your questions, please call your gastroenterologist to clarify.  If you requested that your care partner not be given the details of your procedure findings, then the procedure report has been included in a sealed envelope for you to review at your convenience later.  YOU SHOULD EXPECT: Some feelings of bloating in the abdomen. Passage of more gas than usual.  Walking can help get rid of the air that was put into your GI tract during the procedure and reduce the bloating. If you had a lower endoscopy (such as a colonoscopy or flexible sigmoidoscopy) you may notice spotting of blood in your stool or on the toilet paper. If you underwent a bowel prep for your procedure, you may not have a normal bowel movement for a few days.  Please Note:  You might notice some irritation and congestion in your nose or some drainage.  This is from the oxygen used during your procedure.  There is no need for concern and it should clear up in a day or so.  SYMPTOMS TO REPORT IMMEDIATELY:   Following lower endoscopy (colonoscopy or flexible sigmoidoscopy):  Excessive amounts of blood in the stool  Significant tenderness or worsening of abdominal pains  Swelling of the abdomen that is new, acute  Fever of 100F or higher   For urgent or emergent issues, a gastroenterologist can be reached at any hour by calling [phone]. Do not use MyChart messaging for urgent concerns.    DIET:  We do recommend a small meal at first, but then you may proceed to your regular diet.  Drink plenty of fluids but you should avoid alcoholic beverages for 24 hours.  Try to increase the fiber in your  diet, and drink plenty of water.  Cut back on red meat.  Eat plenty of vegetables and fruit.  Try to cut back on fried foods.   ACTIVITY:  You should plan to take it easy for the rest of today and you should NOT DRIVE or use heavy machinery until tomorrow (because of the sedation medicines used during the test).    FOLLOW UP: Our staff will call the number listed on your records 48-72 hours following your procedure to check on you and address any questions or concerns that you may have regarding the information given to you following your procedure. If we do not reach you, we will leave a message.  We will attempt to reach you two times.  During this call, we will ask if you have developed any symptoms of COVID 19. If you develop any symptoms (ie: fever, flu-like symptoms, shortness of breath, cough etc.) before then, please call [phone].  If you test positive for Covid 19 in the 2 weeks post procedure, please call and report this information to [place].    If any biopsies were taken you will be contacted by phone or by letter within the next 1-3 weeks.  Please call us at [phone] if you have not heard about the biopsies in 3 weeks.    SIGNATURES/CONFIDENTIALITY: You and/or your care partner have signed paperwork which will be entered into your electronic medical  record.  These signatures attest to the fact that that the information above on your After Visit Summary has been reviewed and is understood.  Full responsibility of the confidentiality of this discharge information lies with you and/or your care-partner. 

## 2020-06-18 NOTE — Progress Notes (Signed)
PT taken to PACU. Monitors in place. VSS. Report given to RN. 

## 2020-06-18 NOTE — Op Note (Signed)
[facility] Patient Name: Lacey Wallace Procedure Date: 06/18/2020 9:10 AM MRN: [id number] Endoscopist: Thornton Park MD, MD Age: 65 Referring MD:  Date of Birth: 09/11/1955 Gender: Female Account #: [id number] Procedure:                Colonoscopy Indications:              Surveillance: Personal history of adenomatous                            polyps on last colonoscopy > 5 years ago                           Tubular adenoma x 2 on colonoscopy 2006                           No polyp on colonoscopy 2010                           No known family history of colon cancer or polyps Medicines:                Monitored Anesthesia Care Procedure:                Pre-Anesthesia Assessment:                           - Prior to the procedure, a History and Physical                            was performed, and patient medications and                            allergies were reviewed. The patient's tolerance of                            previous anesthesia was also reviewed. The risks                            and benefits of the procedure and the sedation                            options and risks were discussed with the patient.                            All questions were answered, and informed consent                            was obtained. Prior Anticoagulants: The patient has                            taken no previous anticoagulant or antiplatelet                            agents. ASA Grade Assessment: III - A patient with  severe systemic disease. After reviewing the risks                            and benefits, the patient was deemed in                            satisfactory condition to undergo the procedure.                           After obtaining informed consent, the colonoscope                            was passed under direct vision. Throughout the                            procedure, the patient's blood pressure, pulse, and                             oxygen saturations were monitored continuously. The                            Olympus CF-HQ190 [id number]) Colonoscope was                            introduced through the anus and advanced to the 6                            cm into the ileum. A second forward view of the                            right colon was performed. The colonoscopy was                            performed without difficulty. The patient tolerated                            the procedure well. The quality of the bowel                            preparation was good. The terminal ileum, ileocecal                            valve, appendiceal orifice, and rectum were                            photographed. Scope In: 9:18:51 AM Scope Out: 9:31:57 AM Scope Withdrawal Time: 0 hours 11 minutes 27 seconds  Total Procedure Duration: 0 hours 13 minutes 6 seconds  Findings:                 Hemorrhoids were found on perianal exam.                           Non-bleeding internal hemorrhoids were found.  A few small-mouthed diverticula were found in the                            sigmoid colon and ascending colon.                           The distal ileum contained one pedunculated,                            non-bleeding polyp. The polyp was 3 mm in diameter.                            The polyp was removed with a cold snare. Resection                            and retrieval were complete. Estimated blood loss                            was minimal.                           The exam was otherwise without abnormality on                            direct and retroflexion views. Complications:            No immediate complications. Estimated blood loss:                            Minimal. Estimated Blood Loss:     Estimated blood loss was minimal. Impression:               - Hemorrhoids found on perianal exam.                           - Non-bleeding internal hemorrhoids.                            - Diverticulosis in the sigmoid colon and ascending                            colon.                           - One polyp in the distal ileum, removed with a                            cold snare. Resected and retrieved.                           - The examination was otherwise normal on direct                            and retroflexion views. Recommendation:           - Patient has a contact number available for  emergencies. The signs and symptoms of potential                            delayed complications were discussed with the                            patient. Return to normal activities tomorrow.                            Written discharge instructions were provided to the                            patient.                           - Follow a high fiber diet. Drink at least 64                            ounces of water daily. Add a daily stool bulking                            agent such as psyllium (an exampled would be                            Metamucil).                           - Continue present medications.                           - Await pathology results.                           - Repeat colonoscopy date to be determined after                            pending pathology results are reviewed for                            surveillance.                           - Emerging evidence supports eating a diet of                            fruits, vegetables, grains, calcium, and yogurt                            while reducing red meat and alcohol may reduce the                            risk of colon cancer.                           - Thank you for allowing me to be involved in your  colon cancer prevention. Thornton Park MD, MD 06/18/2020 9:37:18 AM This report has been signed electronically.

## 2020-06-18 NOTE — Progress Notes (Signed)
Called to room to assist during endoscopic procedure.  Patient ID and intended procedure confirmed with present staff. Received instructions for my participation in the procedure from the performing physician.  

## 2020-06-22 ENCOUNTER — Telehealth: Payer: Self-pay

## 2020-06-22 ENCOUNTER — Telehealth: Payer: Self-pay | Admitting: *Deleted

## 2020-06-22 NOTE — Telephone Encounter (Signed)
  Follow up Call-  Call back number 06/18/2020  Post procedure Call Back phone  # [phone]  Permission to leave phone message Yes  Some recent data might be hidden     Patient questions:  Do you have a fever, pain , or abdominal swelling? No. Pain Score  0 *  Have you tolerated food without any problems? Yes.    Have you been able to return to your normal activities? Yes.    Do you have any questions about your discharge instructions: Diet   No. Medications  No. Follow up visit  No.  Do you have questions or concerns about your Care? No.  Actions: * If pain score is 4 or above: No action needed, pain <4. 1. Have you developed a fever since your procedure? no  2.   Have you had an respiratory symptoms (SOB or cough) since your procedure? no  3.   Have you tested positive for COVID 19 since your procedure no  4.   Have you had any family members/close contacts diagnosed with the COVID 19 since your procedure?  no   If yes to any of these questions please route to Joylene John, RN and Joella Prince, RN

## 2020-06-22 NOTE — Telephone Encounter (Signed)
  Follow up Call-  Call back number 06/18/2020  Post procedure Call Back phone  # [phone]  Permission to leave phone message Yes  Some recent data might be hidden     Patient questions:  Message left to call us if necessary.

## 2020-07-23 ENCOUNTER — Encounter: Payer: Self-pay | Admitting: Gastroenterology

## 2021-12-01 ENCOUNTER — Other Ambulatory Visit: Payer: Self-pay

## 2021-12-01 ENCOUNTER — Emergency Department (HOSPITAL_BASED_OUTPATIENT_CLINIC_OR_DEPARTMENT_OTHER)
Admission: EM | Admit: 2021-12-01 | Discharge: 2021-12-01 | Disposition: A | Discharge status: Home / Self Care | Payer: Medicare PPO | Attending: Emergency Medicine | Admitting: Emergency Medicine

## 2021-12-01 ENCOUNTER — Emergency Department (HOSPITAL_BASED_OUTPATIENT_CLINIC_OR_DEPARTMENT_OTHER): Payer: Medicare PPO

## 2021-12-01 DIAGNOSIS — R072 Precordial pain: Secondary | ICD-10-CM | POA: Diagnosis not present

## 2021-12-01 DIAGNOSIS — R079 Chest pain, unspecified: Secondary | ICD-10-CM

## 2021-12-01 DIAGNOSIS — J45909 Unspecified asthma, uncomplicated: Secondary | ICD-10-CM | POA: Diagnosis not present

## 2021-12-01 DIAGNOSIS — Z7984 Long term (current) use of oral hypoglycemic drugs: Secondary | ICD-10-CM | POA: Insufficient documentation

## 2021-12-01 DIAGNOSIS — R110 Nausea: Secondary | ICD-10-CM | POA: Insufficient documentation

## 2021-12-01 DIAGNOSIS — E119 Type 2 diabetes mellitus without complications: Secondary | ICD-10-CM | POA: Insufficient documentation

## 2021-12-01 LAB — CBC WITH DIFFERENTIAL/PLATELET
Abs Immature Granulocytes: 0.02 10*3/uL (ref 0.00–0.07)
Basophils Absolute: 0.1 10*3/uL (ref 0.0–0.1)
Basophils Relative: 1 %
Eosinophils Absolute: 0.3 10*3/uL (ref 0.0–0.5)
Eosinophils Relative: 4 %
HCT: 38.8 % (ref 36.0–46.0)
Hemoglobin: 12.9 g/dL (ref 12.0–15.0)
Immature Granulocytes: 0 %
Lymphocytes Relative: 19 %
Lymphs Abs: 1.3 10*3/uL (ref 0.7–4.0)
MCH: 29.5 pg (ref 26.0–34.0)
MCHC: 33.2 g/dL (ref 30.0–36.0)
MCV: 88.8 fL (ref 80.0–100.0)
Monocytes Absolute: 0.4 10*3/uL (ref 0.1–1.0)
Monocytes Relative: 6 %
Neutro Abs: 5 10*3/uL (ref 1.7–7.7)
Neutrophils Relative %: 70 %
Platelets: 170 10*3/uL (ref 150–400)
RBC: 4.37 MIL/uL (ref 3.87–5.11)
RDW: 13.2 % (ref 11.5–15.5)
WBC: 7.2 10*3/uL (ref 4.0–10.5)
nRBC: 0 % (ref 0.0–0.2)

## 2021-12-01 LAB — COMPREHENSIVE METABOLIC PANEL
ALT: 17 U/L (ref 0–44)
AST: 18 U/L (ref 15–41)
Albumin: 4.1 g/dL (ref 3.5–5.0)
Alkaline Phosphatase: 69 U/L (ref 38–126)
Anion gap: 9 (ref 5–15)
BUN: 32 mg/dL — ABNORMAL HIGH (ref 8–23)
CO2: 22 mmol/L (ref 22–32)
Calcium: 9.3 mg/dL (ref 8.9–10.3)
Chloride: 108 mmol/L (ref 98–111)
Creatinine, Ser: 2.11 mg/dL — ABNORMAL HIGH (ref 0.44–1.00)
GFR, Estimated: 25 mL/min — ABNORMAL LOW (ref >60)
Glucose, Bld: 143 mg/dL — ABNORMAL HIGH (ref 70–99)
Potassium: 3.8 mmol/L (ref 3.5–5.1)
Sodium: 139 mmol/L (ref 135–145)
Total Bilirubin: 0.7 mg/dL (ref 0.3–1.2)
Total Protein: 7.6 g/dL (ref 6.5–8.1)

## 2021-12-01 LAB — TROPONIN I (HIGH SENSITIVITY)
Troponin I (High Sensitivity): 4 ng/L (ref <18)
Troponin I (High Sensitivity): 4 ng/L (ref <18)

## 2021-12-01 MED ORDER — ASPIRIN 81 MG PO CHEW: 324.0000 mg | CHEWABLE_TABLET | Freq: Once | ORAL | Status: DC

## 2021-12-01 NOTE — ED Triage Notes (Signed)
Pt via EMS from home. Pt did not have supper last night and her upstairs AC is broken so she did not rest well. When she got up this morning, she started coming downstairs and had sudden crushing chest pressure that felt like an elephant sitting on her chest. Pt reports associated SOB, nausea, diaphoresis. Hx HTN, DM2, anxiety and depression, no previous panic attacks. Symptoms mostly resolved en route and on arrival pt states that she feels better and apologizes for what she feels like is a waste of our time. No acute distress on arrival although she is still experiencing intermittent waves of nausea.

## 2021-12-01 NOTE — ED Notes (Signed)
Awoke this am at approx 0530hrs, walking down stairs, began having immediate fast HR, nausea, diaphoresis, mid sternal chest pressure. After lying down in the EMS with cool air, began having some relief. Still has some nausea. Has hx of DM and HTN

## 2021-12-01 NOTE — Discharge Instructions (Addendum)
Thankfully all your testing today looks normal.  However if you have another event like this and it does not go away return to the emergency room immediately.  If these do start happening more frequently you need to follow-up with your doctor to make sure your thyroid looks okay and need to follow-up with your cardiologist for further testing if they find that necessary.

## 2021-12-01 NOTE — ED Provider Notes (Signed)
[facility] Provider Note   CSN: [id number] Arrival date & time: 12/01/21  3546     History  Chief Complaint  Patient presents with   Chest Pain    Lacey Wallace is a 66 y.o. female.  Patient is a 66 year old female with a history of prior GI bleed, stroke, diabetes, asthma who is presenting today with complaint of chest pain.  Patient reports that yesterday throughout the day she felt okay but at dinnertime she felt slightly nauseated and did not eat anything but felt better before she went to bed.  She reports waking up this morning and having a hard time sleeping overnight because her air conditioner upstairs was broken.  She reports about 530 or 6:00 she suddenly experienced crushing chest pain, racing heart, shortness of breath and diaphoresis that occurred when she was walking downstairs.  She called a friend who immediately told her to call 911.  She reported when the paramedics arrived she started feeling better and by the time she got out into the ambulance her symptoms were almost completely resolved.  She reports the most severe pain lasted 10 to 15 minutes.  She had no nausea or vomiting.  She denied any wheezing or feeling like it felt like an asthma attack.  She took no medication during this time.  Paramedics reported that patient's vital signs were normal, EKG without acute findings and patient now was apologetic that she came at all.  However she has only had an episode like this once over 20 years ago and does not have anything like this frequently.  She does report seeing her doctor regularly and being compliant with her medications.  She has had no recent medication changes.  No history of DVT or PE, she did have a catheterization in her 6s which showed no acute findings and reports in the past she has seen a cardiologist but everything has already has been okay.  Patient is still reporting she has slight chest tightness and some minimal  nausea.  The history is provided by the patient.  Chest Pain Pain location:  Substernal area      Home Medications Prior to Admission medications   Medication Sig Start Date End Date Taking? Authorizing Provider  albuterol (VENTOLIN HFA) 108 (90 Base) MCG/ACT inhaler Inhale 2 puffs into the lungs every 6 (six) hours as needed.    [provider]  metFORMIN (GLUCOPHAGE) 500 MG tablet Take 1 tablet by mouth daily with breakfast. 09/23/19   [provider]  METHIMAZOLE PO Take 2.5 mg by mouth daily.    [provider]  Multiple Vitamin (MULTIVITAMIN) tablet Take 1 tablet by mouth every other day.    [provider]  nitroGLYCERIN (NITROSTAT) 0.4 MG SL tablet Place 0.4 mg under the tongue every 5 (five) minutes as needed.   Patient not taking: No sig reported    [provider]      Allergies    Kiwi extract, Other, Codeine, and Hydrocodone    Review of Systems   Review of Systems  Cardiovascular:  Positive for chest pain.    Physical Exam Updated Vital Signs BP (!) 150/75 (BP Location: Right Arm)   Pulse 67   Temp 98.2 F (36.8 C) (Oral)   Resp 17   Ht '5\' 8"'$  (1.727 m)   Wt 120.2 kg   SpO2 99%   BMI 40.29 kg/m  Physical Exam Vitals and nursing note reviewed.  Constitutional:  General: She is not in acute distress.    Appearance: She is well-developed.     Comments: Slightly clammy where she had been diaphoretic  HENT:     Head: Normocephalic and atraumatic.  Eyes:     Conjunctiva/sclera: Conjunctivae normal.     Pupils: Pupils are equal, round, and reactive to light.  Cardiovascular:     Rate and Rhythm: Normal rate and regular rhythm.     Heart sounds: No murmur heard. Pulmonary:     Effort: Pulmonary effort is normal. No respiratory distress.     Breath sounds: Normal breath sounds. No wheezing or rales.  Abdominal:     General: There is no distension.     Palpations: Abdomen is soft.     Tenderness: There is no  abdominal tenderness. There is no guarding or rebound.  Musculoskeletal:        General: No tenderness. Normal range of motion.     Cervical back: Normal range of motion and neck supple.     Right lower leg: No edema.     Left lower leg: No edema.  Skin:    General: Skin is warm and dry.     Findings: No erythema or rash.  Neurological:     Mental Status: She is alert and oriented to person, place, and time.  Psychiatric:        Behavior: Behavior normal.     ED Results / Procedures / Treatments   Labs (all labs ordered are listed, but only abnormal results are displayed) Labs Reviewed  COMPREHENSIVE METABOLIC PANEL - Abnormal; Notable for the following components:      Result Value   Glucose, Bld 143 (*)    BUN 32 (*)    Creatinine, Ser 2.11 (*)    GFR, Estimated 25 (*)    All other components within normal limits  CBC WITH DIFFERENTIAL/PLATELET  TROPONIN I (HIGH SENSITIVITY)  TROPONIN I (HIGH SENSITIVITY)    EKG EKG Interpretation  Date/Time:  Wednesday December 01 2021 07:14:18 EDT Ventricular Rate:  70 PR Interval:  181 QRS Duration: 92 QT Interval:  410 QTC Calculation: 443 R Axis:   46 Text Interpretation: Sinus rhythm RSR' in V1 or V2, right VCD or RVH Borderline T abnormalities, anterior leads No significant change since last tracing Confirmed by Blanchie Dessert [phone]) on 12/01/2021 8:05:02 AM  Radiology DG Chest Port 1 View  Result Date: 12/01/2021 CLINICAL DATA:  Chest pain and shortness of breath.  Diaphoresis. EXAM: PORTABLE CHEST 1 VIEW COMPARISON:  11/26/2019 FINDINGS: The heart size and mediastinal contours are within normal limits. Both lungs are clear. The visualized skeletal structures are unremarkable. IMPRESSION: No active disease. Electronically Signed   By: Nelson Chimes M.D.   On: 12/01/2021 08:12    Procedures Procedures    Medications Ordered in ED Medications  aspirin chewable tablet 324 mg (has no administration in time range)    ED  Course/ Medical Decision Making/ A&P                           Medical Decision Making Amount and/or Complexity of Data Reviewed Independent Historian: EMS External Data Reviewed: notes. Labs: ordered. Decision-making details documented in ED Course. Radiology: ordered and independent interpretation performed. Decision-making details documented in ED Course. ECG/medicine tests: ordered and independent interpretation performed. Decision-making details documented in ED Course.  Risk OTC drugs.   Pt with multiple medical problems and comorbidities and presenting today with  a complaint that caries a high risk for morbidity and mortality.  Here today with chest pain, shortness of breath, palpitations and diaphoresis.  Patient reports her symptoms are almost completely resolved but she does have some slight chest tightness but denies any pain.  Patient is well-appearing on exam.  She is mildly hypertensive here at 151/75 but otherwise vital signs are normal.  She has no reproducible pain and denies any abdominal pain.  Low suspicion for acute abdominal pathology such as cholelithiasis, pancreatitis or perforation.  Patient is not having symptoms classic for pneumonia, asthma exacerbation.  Low suspicion at this time for pericarditis, myocarditis, PE or DVT.  Concern for ACS versus panic attack versus brief dysrhythmia.  I independently interpreted patient's labs and EKG today.  EKG without acute changes today, CBC within normal limits, CMP with creatinine of 2.11 which based on outside records and most recent testing 1 year ago renal function was between 1.6-1.75. initial trop is 4. Patient was given 324 of aspirin.  Will need delta troponins given the time between when pain started and evaluation. I have independently visualized and interpreted pt's images today.CXR wnl. On repeat evaluation patient is feeling better.  Delta troponin is negative at 4.  At this time do not feel that patient meets any  admission criteria and that she can be discharged home.  Return precautions were discussed and encourage patient to follow-up with PCP if she has any further events or to return to the emergency room if she has another event and it does not go away.  Discussed dysrhythmias versus thyroid issues.  She is currently being followed by her PCP for this.  All of patient's questions were answered.  She was discharged home in good condition.  No social barriers affecting discharge today.        Final Clinical Impression(s) / ED Diagnoses Final diagnoses:  Nonspecific chest pain    Rx / DC Orders ED Discharge Orders     None         Blanchie Dessert, MD 12/01/21 1030

## 2021-12-30 IMAGING — US US RENAL
1 series · 14 of 25 positions shown · non-contrast
Comparison: 03/09/2004 CT with contrast

CLINICAL DATA: Chronic kidney disease stage 3 B

EXAM:
RENAL / URINARY TRACT ULTRASOUND COMPLETE

[Series 1: us renal · 0.26mm/px · 14 of 40 slices shown]
[im 1/40]
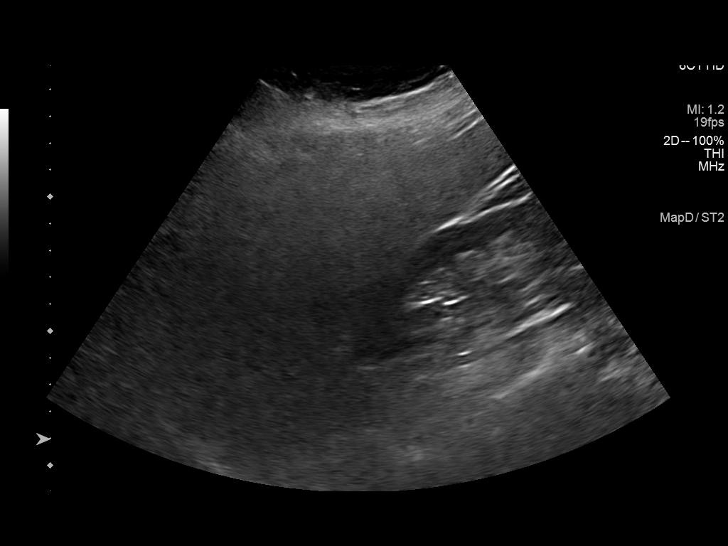
[im 4/40]
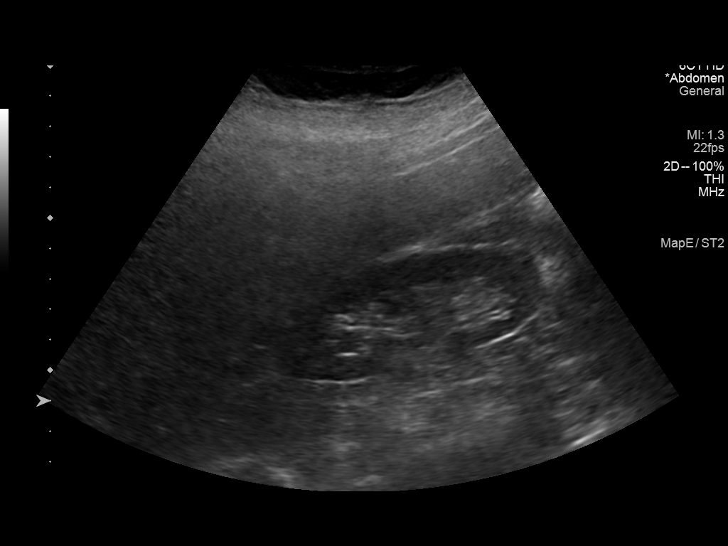
[im 7/40]
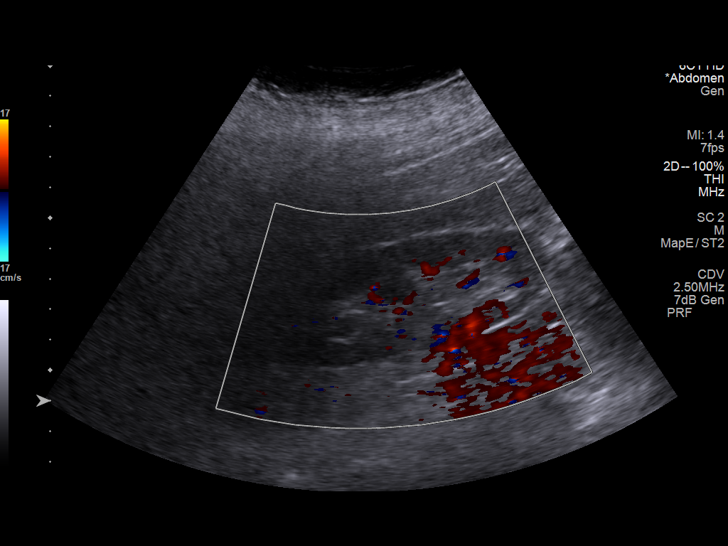
[im 10/40]
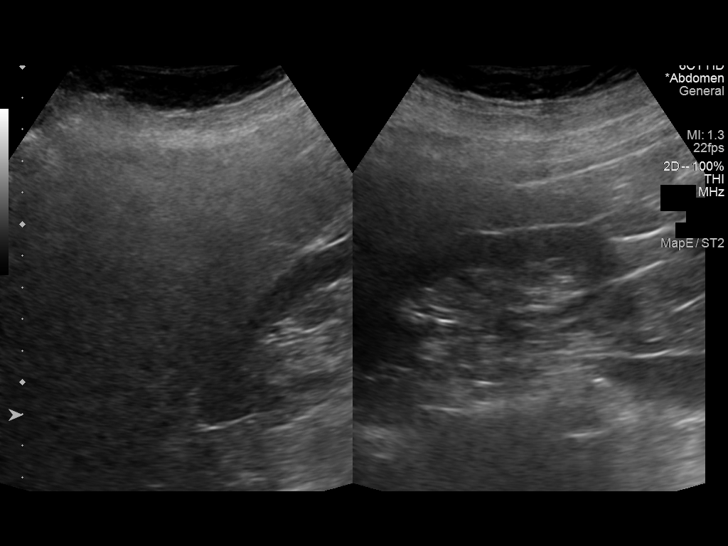
[im 14/40]
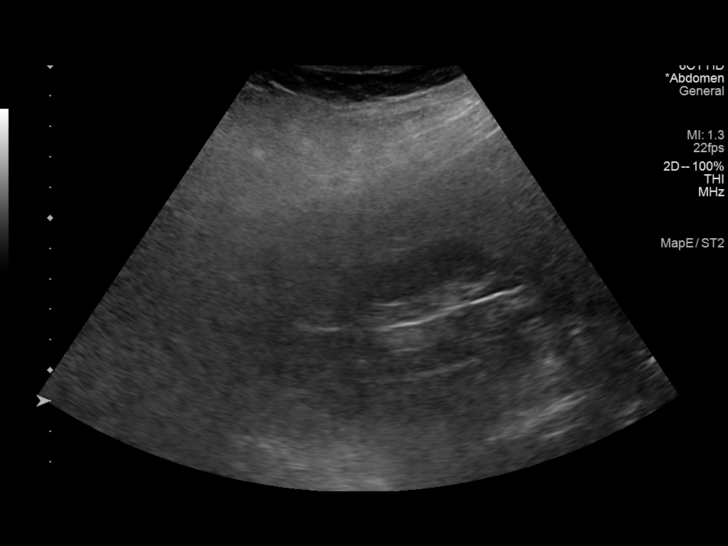
[im 15/40]
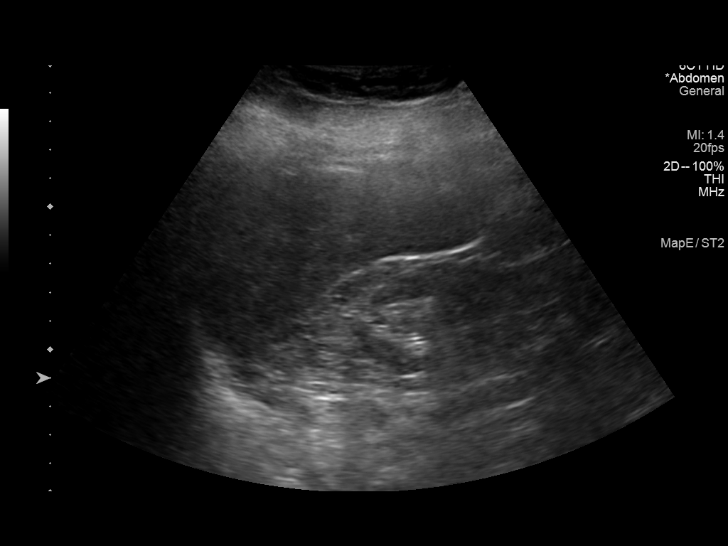
[im 18/40]
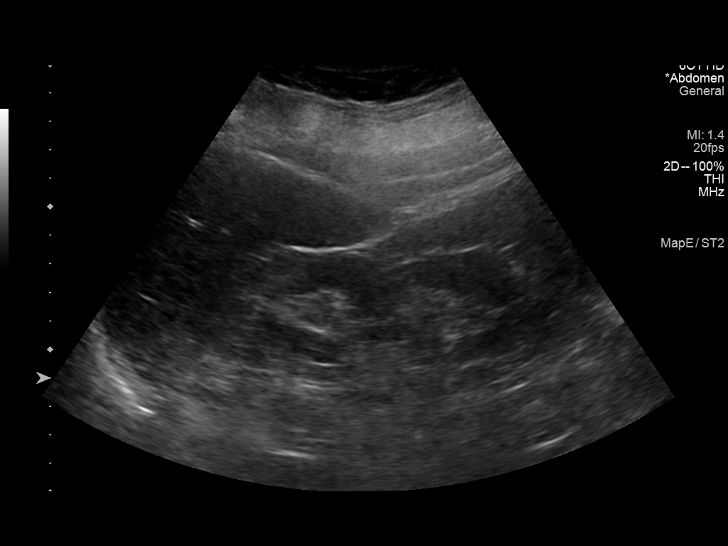
[im 22/40]
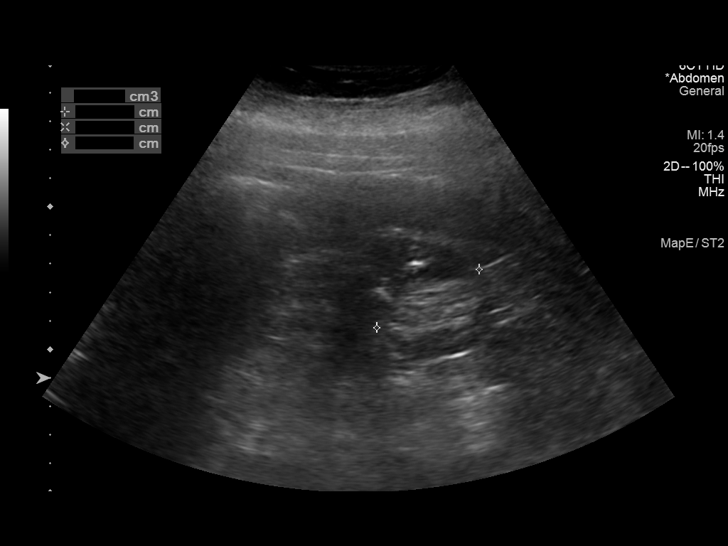
[im 25/40]
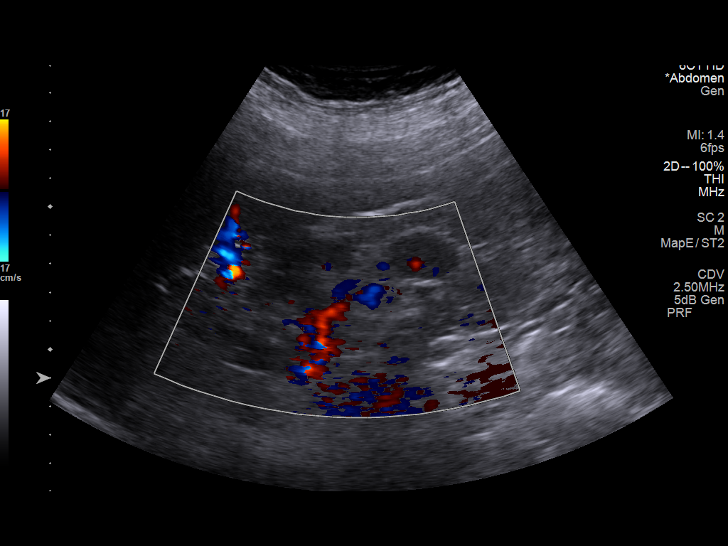
[im 27/40]
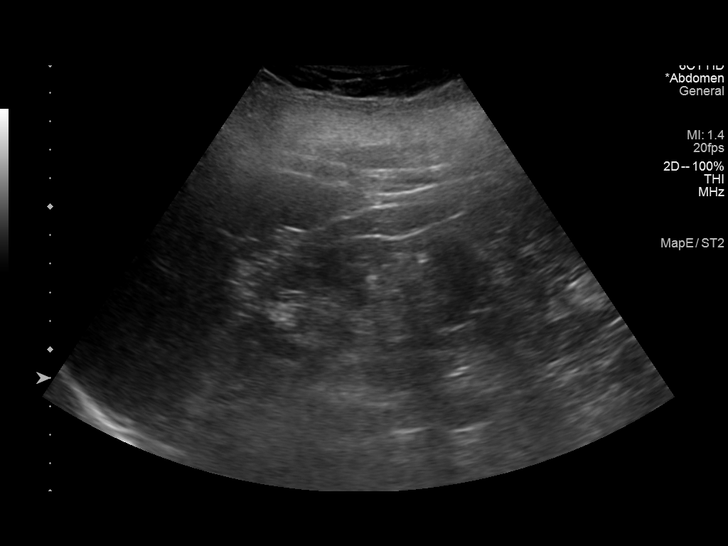
[im 30/40]
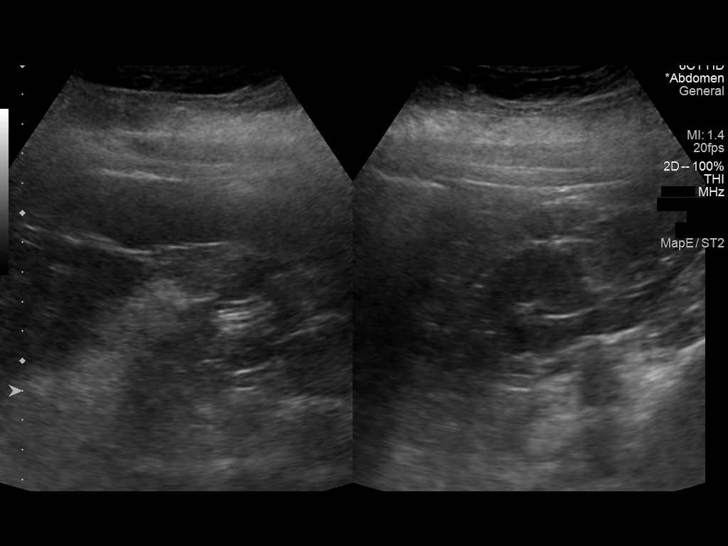
[im 33/40]
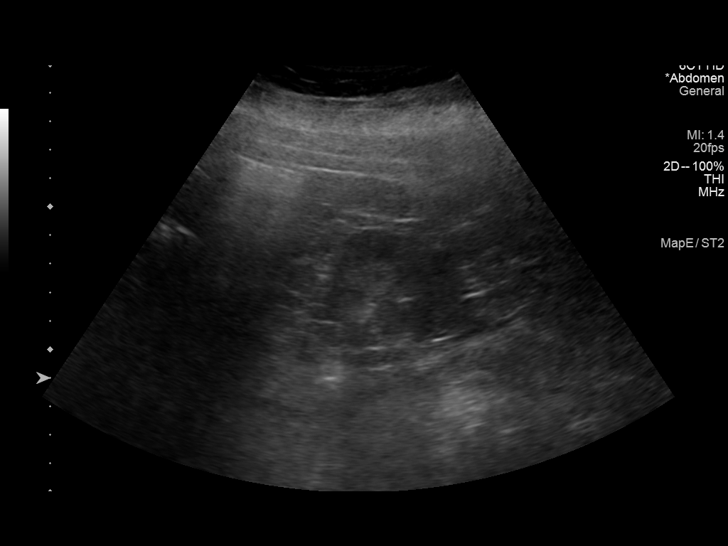
[im 36/40]
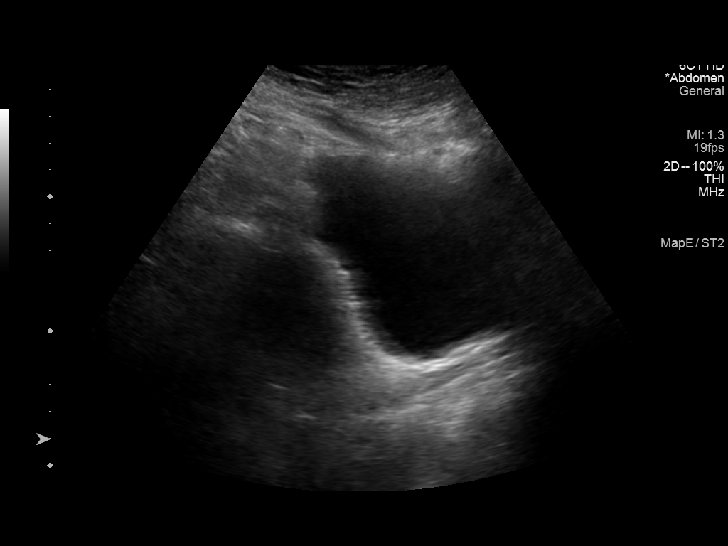
[im 40/40]
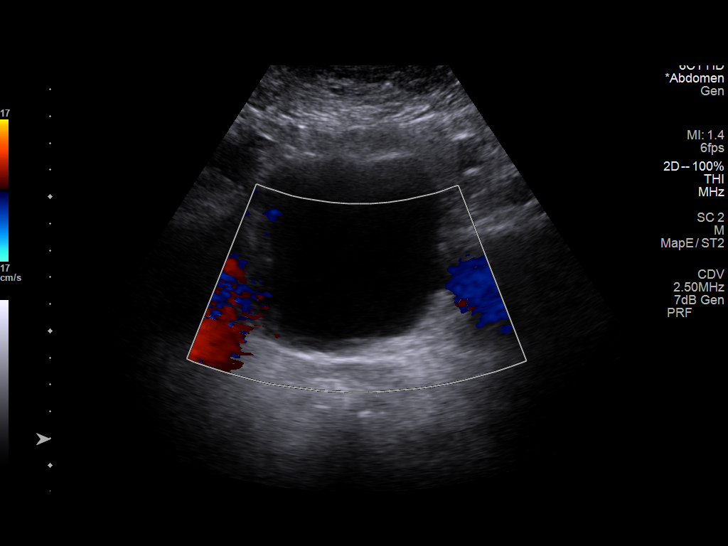

[14 of 25 positions shown; findings below may reference images not displayed]

FINDINGS: Right Kidney:

Renal measurements: 9.3 x 3.6 x 4.4 cm = volume: 78 mL. Echogenicity
within normal limits. No mass or hydronephrosis visualized.

Left Kidney:

Renal measurements: 10.0 x 4.0 x 4.1 cm = volume: 86 mL.
Echogenicity within normal limits. No mass or hydronephrosis. Slight
cortical thinning in the upper and midpole regions compatible with
scarring.

Bladder:

Appears normal for degree of bladder distention.

Other:

No free fluid
IMPRESSION: Left renal cortical scarring. No other significant finding by
ultrasound.

## 2023-01-24 ENCOUNTER — Other Ambulatory Visit: Payer: Self-pay

## 2023-01-24 ENCOUNTER — Emergency Department (HOSPITAL_BASED_OUTPATIENT_CLINIC_OR_DEPARTMENT_OTHER)
Admission: EM | Admit: 2023-01-24 | Discharge: 2023-01-24 | Disposition: A | Discharge status: Home / Self Care | Payer: Medicare PPO | Attending: Emergency Medicine | Admitting: Emergency Medicine

## 2023-01-24 ENCOUNTER — Encounter (HOSPITAL_BASED_OUTPATIENT_CLINIC_OR_DEPARTMENT_OTHER): Payer: Self-pay | Admitting: Emergency Medicine

## 2023-01-24 DIAGNOSIS — E119 Type 2 diabetes mellitus without complications: Secondary | ICD-10-CM | POA: Insufficient documentation

## 2023-01-24 DIAGNOSIS — Z20822 Contact with and (suspected) exposure to covid-19: Secondary | ICD-10-CM | POA: Insufficient documentation

## 2023-01-24 DIAGNOSIS — J45909 Unspecified asthma, uncomplicated: Secondary | ICD-10-CM | POA: Insufficient documentation

## 2023-01-24 DIAGNOSIS — M2550 Pain in unspecified joint: Secondary | ICD-10-CM | POA: Diagnosis present

## 2023-01-24 DIAGNOSIS — M130 Polyarthritis, unspecified: Secondary | ICD-10-CM | POA: Diagnosis not present

## 2023-01-24 DIAGNOSIS — R509 Fever, unspecified: Secondary | ICD-10-CM | POA: Diagnosis not present

## 2023-01-24 DIAGNOSIS — I2510 Atherosclerotic heart disease of native coronary artery without angina pectoris: Secondary | ICD-10-CM | POA: Insufficient documentation

## 2023-01-24 DIAGNOSIS — N189 Chronic kidney disease, unspecified: Secondary | ICD-10-CM | POA: Insufficient documentation

## 2023-01-24 DIAGNOSIS — I129 Hypertensive chronic kidney disease with stage 1 through stage 4 chronic kidney disease, or unspecified chronic kidney disease: Secondary | ICD-10-CM | POA: Diagnosis not present

## 2023-01-24 LAB — CK: Total CK: 94 U/L (ref 38–234)

## 2023-01-24 LAB — BASIC METABOLIC PANEL
Anion gap: 12 (ref 5–15)
BUN: 28 mg/dL — ABNORMAL HIGH (ref 8–23)
CO2: 22 mmol/L (ref 22–32)
Calcium: 9.6 mg/dL (ref 8.9–10.3)
Chloride: 103 mmol/L (ref 98–111)
Creatinine, Ser: 1.74 mg/dL — ABNORMAL HIGH (ref 0.44–1.00)
GFR, Estimated: 32 mL/min — ABNORMAL LOW (ref >60)
Glucose, Bld: 118 mg/dL — ABNORMAL HIGH (ref 70–99)
Potassium: 3.6 mmol/L (ref 3.5–5.1)
Sodium: 137 mmol/L (ref 135–145)

## 2023-01-24 LAB — RESP PANEL BY RT-PCR (RSV, FLU A&B, COVID)  RVPGX2
Influenza A by PCR: NEGATIVE
Influenza B by PCR: NEGATIVE
Resp Syncytial Virus by PCR: NEGATIVE
SARS Coronavirus 2 by RT PCR: NEGATIVE

## 2023-01-24 LAB — MAGNESIUM: Magnesium: 1.8 mg/dL (ref 1.7–2.4)

## 2023-01-24 MED ORDER — ONDANSETRON 4 MG PO TBDP: 4.0000 mg | ORAL_TABLET | Freq: Three times a day (TID) | ORAL | 0 refills | Status: AC | PRN

## 2023-01-24 MED ORDER — SENNOSIDES-DOCUSATE SODIUM 8.6-50 MG PO TABS: 1.0000 | ORAL_TABLET | Freq: Every evening | ORAL | 0 refills | Status: AC | PRN

## 2023-01-24 MED ORDER — SODIUM CHLORIDE 0.9 % IV BOLUS
500.0000 mL | Freq: Once | INTRAVENOUS | Status: AC
  Administered 2023-01-24: 500 mL via INTRAVENOUS

## 2023-01-24 MED ORDER — PREDNISONE 10 MG PO TABS: 20.0000 mg | ORAL_TABLET | Freq: Every day | ORAL | 0 refills | Status: AC

## 2023-01-24 MED ORDER — OXYCODONE-ACETAMINOPHEN 5-325 MG PO TABS: 1.0000 | ORAL_TABLET | Freq: Four times a day (QID) | ORAL | 0 refills | Status: AC | PRN

## 2023-01-24 NOTE — ED Triage Notes (Signed)
Pt sts she feels like she got a virus last night and that her joints are affected today; sts she is unable to stand and walk

## 2023-01-24 NOTE — ED Provider Notes (Signed)
Emergency Department Provider Note   I have reviewed the triage vital signs and the nursing notes.   HISTORY  Chief Complaint No chief complaint on file.   HPI Lacey Wallace is a 67 y.o. female with past medical history of asthma, arthritis, psoriasis presents to the emergency department with severe polyarthropathy and subjective fever.  Patient felt hot/cold while on vacation at the beach over the past couple of days.  She had a mild rash over her chest and back which resolved.  No known tick bites or other animal exposures.  She felt like she may be getting a viral infection with some mild myalgias but this morning awoke with pain in her bilateral feet, ankles, knees, left elbow.  No obvious joint erythema or swelling.  No injuries.  She is not immune compromised. No new medications.    Past Medical History:  Diagnosis Date   Allergy    seasonal allergies   Anemia    hx of   Anginal pain (HCC)    "years ago, but it has resolved"   Anxiety disorder    situational    Arthritis    neck, hips, ankles   Asthma    Asthma    seasonal, hasnt had any trouble in 3-4 years   Blood clots in brain    at age 47   CAD (coronary artery disease)    Cataract    bilateral sx   Chronic kidney disease    reimplanted ureter tubes at birth   Colon polyps    adennomatous   Depression    hx of   Depression    hospitalized in 1986, no longer needs meds   Diabetes mellitus without complication (HCC)    on meds   GI bleed    Graves disease    on meds   HLD (hyperlipidemia)    diet controlled   Hypertension    situational    Obesity    Sleep apnea    not prescribed a CPAP   Stricture and stenosis of esophagus    Stroke (HCC)    TIA at age 89   Thyroid disease    on meds   UTI (urinary tract infection)     Review of Systems  Constitutional: No fever/chills Cardiovascular: Denies chest pain. Respiratory: Denies shortness of breath. Gastrointestinal: No abdominal pain.  No  nausea, no vomiting.  No diarrhea.  No constipation. Genitourinary: Negative for dysuria. Musculoskeletal: Negative for back pain. Positive diffuse joint pain.  Skin: Negative for rash. Neurological: Negative for headaches, focal weakness or numbness.  ____________________________________________   PHYSICAL EXAM:  VITAL SIGNS: ED Triage Vitals  Encounter Vitals Group     BP 01/24/23 1834 136/77     Pulse Rate 01/24/23 1834 74     Resp 01/24/23 1834 18     Temp 01/24/23 1834 (!) 97.5 F (36.4 C)     SpO2 01/24/23 1834 96 %     Weight 01/24/23 1832 270 lb (122.5 kg)     Height 01/24/23 1832 5\' 8"  (1.727 m)    Constitutional: Alert and oriented. Well appearing and in no acute distress. Eyes: Conjunctivae are normal.  Head: Atraumatic. Nose: No congestion/rhinnorhea. Mouth/Throat: Mucous membranes are moist.  Neck: No stridor.   Cardiovascular: Normal rate, regular rhythm. Good peripheral circulation. Grossly normal heart sounds.   Respiratory: Normal respiratory effort.  No retractions. Lungs CTAB. Gastrointestinal: Soft and nontender. No distention.  Musculoskeletal: No lower extremity tenderness nor edema. No  gross deformities of extremities. No joint effusions.  Neurologic:  Normal speech and language. No gross focal neurologic deficits are appreciated.  Skin:  Skin is warm, dry and intact. No rash noted.  ____________________________________________   LABS (all labs ordered are listed, but only abnormal results are displayed)  Labs Reviewed  RESP PANEL BY RT-PCR (RSV, FLU A&B, COVID)  [id number]  BASIC METABOLIC PANEL  CBC WITH DIFFERENTIAL/PLATELET  CK  MAGNESIUM   ____________________________________________  EKG  *** ____________________________________________  RADIOLOGY  No results found.  ____________________________________________   PROCEDURES  Procedure(s) performed:   Procedures   ____________________________________________   INITIAL  IMPRESSION / ASSESSMENT AND PLAN / ED COURSE  Pertinent labs & imaging results that were available during my care of the patient were reviewed by me and considered in my medical decision making (see chart for details).   This patient is Presenting for Evaluation of myalgias, which does require a range of treatment options, and is a complaint that involves a high risk of morbidity and mortality.  The Differential Diagnoses include viral infection, tick-borne illness, COVID, rheumatologic issue, etc.  Critical Interventions-    Medications  sodium chloride 0.9 % bolus 500 mL (has no administration in time range)    Reassessment after intervention:     Clinical Laboratory Tests Ordered, included   Radiologic Tests Ordered, included ***. I independently interpreted the images and agree with radiology interpretation.   Cardiac Monitor Tracing which shows NSR.    Social Determinants of Health Risk patient is a non-smoker.   Consult complete with  Medical Decision Making: Summary:  Patient presents emergency department with myalgias and joint pain.  No evidence on my exam to suggest septic arthritis.  Plan for labs, IV fluids, and search for viral process.  No known tick exposures/rash.  Patient afebrile on arrival.  Reevaluation with update and discussion with   ***Considered admission***  Patient's presentation is most consistent with acute presentation with potential threat to life or bodily function.   Disposition:   ____________________________________________  FINAL CLINICAL IMPRESSION(S) / ED DIAGNOSES  Final diagnoses:  None     NEW OUTPATIENT MEDICATIONS STARTED DURING THIS VISIT:  New Prescriptions   No medications on file    Note:  This document was prepared using Dragon voice recognition software and may include unintentional dictation errors.  Alona Bene, MD, [facility]

## 2023-01-24 NOTE — Discharge Instructions (Signed)
You were seen in the emergency department today with pain in many of your joints.  I am unsure if this is a resolving viral infection or possibly some developing rheumatologic issue but your lab work here is reassuring.  I have called in a small number of pain medicines but be advised this will cause constipation and can impair your ability to drive a car.  He is can become addicting.  Please take only as needed for severe pain and while at rest in bed.  I am starting you on a short course of lower dose steroids.  Your blood sugars and blood pressures may run higher than normal for the next few days but that should resolve.  If you develop any new or suddenly worsening symptoms please return to the emergency department, otherwise, please follow closely with your primary care doctor in the coming week.

## 2023-01-26 LAB — CBC WITH DIFFERENTIAL/PLATELET
Abs Immature Granulocytes: 0.03 10*3/uL (ref 0.00–0.07)
Basophils Absolute: 0 10*3/uL (ref 0.0–0.1)
Basophils Relative: 0 %
Eosinophils Absolute: 0.1 10*3/uL (ref 0.0–0.5)
Eosinophils Relative: 1 %
HCT: 39 % (ref 36.0–46.0)
Hemoglobin: 12.8 g/dL (ref 12.0–15.0)
Immature Granulocytes: 0 %
Lymphocytes Relative: 18 %
Lymphs Abs: 1.9 10*3/uL (ref 0.7–4.0)
MCH: 29.2 pg (ref 26.0–34.0)
MCHC: 32.8 g/dL (ref 30.0–36.0)
MCV: 89 fL (ref 80.0–100.0)
Monocytes Absolute: 0.9 10*3/uL (ref 0.1–1.0)
Monocytes Relative: 8 %
Neutro Abs: 7.6 10*3/uL (ref 1.7–7.7)
Neutrophils Relative %: 73 %
Platelets: 174 10*3/uL (ref 150–400)
RBC: 4.38 MIL/uL (ref 3.87–5.11)
RDW: 13.2 % (ref 11.5–15.5)
WBC: 10.5 10*3/uL (ref 4.0–10.5)
nRBC: 0 % (ref 0.0–0.2)
